# Patient Record
Sex: Female | Born: 1955 | Race: White | Hispanic: No | Marital: Married | Smoking: Never smoker
Health system: Southern US, Community
[De-identification: ages and names within clinical notes are randomized; demographics above are authoritative.]

## PROBLEM LIST (undated history)

## (undated) DIAGNOSIS — Z1159 Encounter for screening for other viral diseases: Secondary | ICD-10-CM

## (undated) DIAGNOSIS — L900 Lichen sclerosus et atrophicus: Secondary | ICD-10-CM

## (undated) DIAGNOSIS — D693 Immune thrombocytopenic purpura: Secondary | ICD-10-CM

## (undated) DIAGNOSIS — I10 Essential (primary) hypertension: Secondary | ICD-10-CM

## (undated) HISTORY — DX: Essential (primary) hypertension: I10

## (undated) HISTORY — DX: Encounter for screening for other viral diseases: Z11.59

## (undated) HISTORY — PX: TUBAL LIGATION: SHX77

## (undated) HISTORY — PX: OTHER SURGICAL HISTORY: SHX169

## (undated) HISTORY — DX: Lichen sclerosus et atrophicus: L90.0

## (undated) HISTORY — PX: TONSILLECTOMY: SUR1361

## (undated) HISTORY — DX: Immune thrombocytopenic purpura: D69.3

---

## 1998-01-26 ENCOUNTER — Other Ambulatory Visit: Admission: RE | Admit: 1998-01-26 | Discharge: 1998-01-26 | Payer: Self-pay | Admitting: Obstetrics and Gynecology

## 1999-12-06 ENCOUNTER — Other Ambulatory Visit: Admission: RE | Admit: 1999-12-06 | Discharge: 1999-12-06 | Payer: Self-pay | Admitting: Gynecology

## 2000-06-24 ENCOUNTER — Encounter: Admission: RE | Admit: 2000-06-24 | Discharge: 2000-06-24 | Payer: Self-pay

## 2001-10-15 ENCOUNTER — Other Ambulatory Visit: Admission: RE | Admit: 2001-10-15 | Discharge: 2001-10-15 | Payer: Self-pay | Admitting: Gynecology

## 2003-02-03 ENCOUNTER — Other Ambulatory Visit: Admission: RE | Admit: 2003-02-03 | Discharge: 2003-02-03 | Payer: Self-pay | Admitting: Gynecology

## 2004-09-09 ENCOUNTER — Ambulatory Visit: Payer: Self-pay | Admitting: Hematology & Oncology

## 2004-10-25 ENCOUNTER — Ambulatory Visit: Payer: Self-pay | Admitting: Hematology & Oncology

## 2004-12-19 ENCOUNTER — Ambulatory Visit: Payer: Self-pay | Admitting: Hematology & Oncology

## 2005-02-26 ENCOUNTER — Ambulatory Visit: Payer: Self-pay | Admitting: Hematology & Oncology

## 2005-04-23 ENCOUNTER — Ambulatory Visit: Payer: Self-pay | Admitting: Hematology & Oncology

## 2005-06-06 ENCOUNTER — Inpatient Hospital Stay (HOSPITAL_COMMUNITY): Admission: RE | Admit: 2005-06-06 | Discharge: 2005-06-08 | Payer: Self-pay | Admitting: General Surgery

## 2005-06-06 ENCOUNTER — Encounter (INDEPENDENT_AMBULATORY_CARE_PROVIDER_SITE_OTHER): Payer: Self-pay | Admitting: Specialist

## 2005-07-09 ENCOUNTER — Ambulatory Visit: Payer: Self-pay | Admitting: Hematology & Oncology

## 2006-10-26 ENCOUNTER — Other Ambulatory Visit: Admission: RE | Admit: 2006-10-26 | Discharge: 2006-10-26 | Payer: Self-pay | Admitting: Gynecology

## 2008-02-03 ENCOUNTER — Other Ambulatory Visit: Admission: RE | Admit: 2008-02-03 | Discharge: 2008-02-03 | Payer: Self-pay | Admitting: Gynecology

## 2009-02-01 ENCOUNTER — Ambulatory Visit: Payer: Self-pay | Admitting: Hematology & Oncology

## 2009-02-01 LAB — CBC WITH DIFFERENTIAL (CANCER CENTER ONLY)
BASO#: 0.1 10*3/uL (ref 0.0–0.2)
BASO%: 0.9 % (ref 0.0–2.0)
EOS%: 2.1 % (ref 0.0–7.0)
HGB: 12.8 g/dL (ref 11.6–15.9)
MCH: 30.4 pg (ref 26.0–34.0)
MCHC: 33.8 g/dL (ref 32.0–36.0)
MONO%: 8.5 % (ref 0.0–13.0)
NEUT#: 8.4 10*3/uL — ABNORMAL HIGH (ref 1.5–6.5)
NEUT%: 59.3 % (ref 39.6–80.0)
RDW: 12.6 % (ref 10.5–14.6)

## 2009-02-01 LAB — CHCC SATELLITE - SMEAR

## 2009-02-01 LAB — RETICULOCYTES (CHCC)
ABS Retic: 34.3 10*3/uL (ref 19.0–186.0)
RBC.: 4.29 MIL/uL (ref 3.87–5.11)

## 2009-02-01 LAB — TECHNOLOGIST REVIEW CHCC SATELLITE

## 2009-07-04 ENCOUNTER — Ambulatory Visit: Payer: Self-pay | Admitting: Gynecology

## 2009-07-04 ENCOUNTER — Other Ambulatory Visit: Admission: RE | Admit: 2009-07-04 | Discharge: 2009-07-04 | Payer: Self-pay | Admitting: Gynecology

## 2009-07-04 ENCOUNTER — Encounter: Payer: Self-pay | Admitting: Gynecology

## 2009-07-09 ENCOUNTER — Ambulatory Visit: Payer: Self-pay | Admitting: Gynecology

## 2009-07-24 ENCOUNTER — Ambulatory Visit: Payer: Self-pay | Admitting: Hematology & Oncology

## 2009-09-10 ENCOUNTER — Ambulatory Visit: Payer: Self-pay | Admitting: Gynecology

## 2010-02-22 ENCOUNTER — Ambulatory Visit: Payer: Self-pay | Admitting: Gynecology

## 2010-07-31 ENCOUNTER — Other Ambulatory Visit: Admission: RE | Admit: 2010-07-31 | Discharge: 2010-07-31 | Payer: Self-pay | Admitting: Gynecology

## 2010-07-31 ENCOUNTER — Ambulatory Visit: Payer: Self-pay | Admitting: Gynecology

## 2010-08-13 HISTORY — PX: OTHER SURGICAL HISTORY: SHX169

## 2010-08-26 ENCOUNTER — Ambulatory Visit: Payer: Self-pay | Admitting: Gynecology

## 2010-08-30 ENCOUNTER — Ambulatory Visit: Payer: Self-pay | Admitting: Gynecology

## 2010-09-16 ENCOUNTER — Ambulatory Visit: Payer: Self-pay | Admitting: Gynecology

## 2011-02-28 NOTE — Discharge Summary (Signed)
[name], [name] NO.:  [id number]   MEDICAL RECORD NO.:  [id number]          PATIENT TYPE:  INP   LOCATION:  1402                         FACILITY:  [facility]   PHYSICIAN:  Adolph Pollack, M.D.DATE OF BIRTH:  04-24-1956   DATE OF ADMISSION:  06/06/2005  DATE OF DISCHARGE:  06/08/2005                                 DISCHARGE SUMMARY   PRINCIPAL DISCHARGE DIAGNOSES:  ITP.   SECONDARY DIAGNOSES:  None.   PROCEDURE:  Laparoscopic splenectomy and excision of accessory spleen.   REASON FOR ADMISSION:  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  She was admitted for  elective laparoscopic splenectomy.   HOSPITAL COURSE:  She underwent a laparoscopic splenectomy and tolerated  this well.  Postoperative day #1 her platelet count was 463,000 and second  postoperative day was 466,000.  Her second postoperative day she is  tolerating a diet, passing gas, felt well, and was able to be discharged.   DISPOSITION:  Discharged to home in satisfactory condition June 08, 2005.  She was given activity restrictions and dietary instructions.  She was given  Tylox for pain and will follow up with me in the office in two to three  weeks.      Adolph Pollack, M.D.  Electronically Signed     TJR/MEDQ  D:  06/18/2005  T:  06/18/2005  Job:  [id number]   cc:   Rose Phi. Myna Hidalgo, M.D.  [address] [facility]  [place], [place] [address]  Fax: [phone]   Raynelle Jan, M.D.  [address]  [place]  [place] [address]  Fax: [phone]

## 2011-02-28 NOTE — Op Note (Signed)
Meghan Reyes, TAX NO.:  [id number]   MEDICAL RECORD NO.:  [id number]          PATIENT TYPE:  INP   LOCATION:  0001                         FACILITY:  [facility]   PHYSICIAN:  Adolph Pollack, M.D.DATE OF BIRTH:  1956/01/18   DATE OF PROCEDURE:  06/06/2005  DATE OF DISCHARGE:                                 OPERATIVE REPORT   PREOPERATIVE DIAGNOSIS:  Idiopathic thrombocytopenic purpura.   POSTOPERATIVE DIAGNOSIS:  Idiopathic thrombocytopenic purpura.   PROCEDURE:  1.  Laparoscopic splenectomy.  2.  Laparoscopic resection of accessory spleen.   SURGEON:  Adolph Pollack, M.D.   ASSISTANT:  Clovis Pu. Cornett, M.D.   ANESTHESIA:  General.   INDICATIONS:  Meghan Reyes is a 55 year old female who has had a long history  of thrombocytopenia. She had been responding initially to steroid treatment,  IVIG and rituxan treatments. However, she subsequently became more  refractory to treatment and has elected to undergo a laparoscopic  splenectomy. She does get a very temporary boosts with IVIG. We discussed  the procedure and the risks including need for open splenectomy. She has  been given IVIG with a good boost in her platelet count.   TECHNIQUE:  She is seen in the holding area. She is brought to the operating  room, placed supine on the operating table and general anesthetic was  administered. Using a bean bag, she was rotated left side up approximately  45 degrees. A Foley catheter had been placed in the bladder. An orogastric  tube was placed. The abdominal wall was sterilely prepped and draped. She  was turned supine and an epigastric incision was made through the skin,  subcutaneous tissue, fascia and peritoneum. A pursestring suture of #0  Vicryl was placed around the fascial edges. A Hassan trocar was introduced  into the peritoneal cavity and pneumoperitoneum was created by insufflation  of CO2 gas. Next the laparoscope was introduced. A 10 mm trocar  was placed  through a subxiphoid incision. A 10 mm trocar was placed to the posterior  axillary line distal to the costal margin. A 10 mm trocar was then placed in  the mid clavicular line in the left upper quadrant. I began by noting that  she had significant of hepatomegaly with a fatty liver and this would become  somewhat problematic later in the operation with respect to exposure.  However, I was able to place her into the reverse Trendelenburg position,  rotate the left side up and identify the spleen. Using the harmonic scalpel,  I began dividing the lateral attachments including the attachments between  the spleen and the colon, spleen and diaphragm, mobilizing the spleen from  lateral to medial. The plane of dissection was kept close to the spleen. I  then began dissecting more toward the hilum staying right on the spleen and  dividing small vessels between clips and using the harmonic scalpel. Once I  had what I felt were adequate  lateral mobilization I then approached the  medial side.   In attempting to identify the short gastric vessels, the large liver was in  impingement  with respect to exposure. I subsequently placed a fifth trocar  in the left upper quadrant between the posterior axillary line trocar and  the mid clavicular line trocar. I then used a triangulated retraction device  to retract the large left lobe of the liver anteriorly. 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 I left this for  later excision.   I then began dividing the short gastric vessels with the harmonic scalpel.  This was made more difficult and took more time because of the large liver  although we were eventually able to get exposure and proceed very slowly. I  then performed some dissection of the medial aspect of the spleen away from  the diaphragmatic attachments also superiorly. I was able to lift the spleen  up and identify the hilum. Using a vascular linear  cutting stapler, I was  able to divide the splenic hilum in there separate loads staying above the  pancreatic tail which was identified. There were some remaining short  gastric vessels and these were divided with the harmonic scalpel. The spleen  was then free from its attachments.   I removed the epigastric trocar and placed a large bag through that incision  into the abdominal cavity and placed the spleen into it. I then approximated  the bag to the anterior abdominal wall and in a piecemeal fashion removed  the spleen.   I inspected the left upper quadrant area and I did not note any significant  act of bleeding just old clots which I evacuated. The estimated blood loss  total was about 300 mL. I then identified the accessory spleen and using a  harmonic scalpel, I excised it, placed it in the bag and brought it out  through one of the trocar sites and put it with the morcellated spleen  specimen. I once again inspected the area and removed the liver retractor.  Hemostasis was adequate. No damage to the tail of pancreas was noted no  intestinal injury was noted.   Following this, I removed the trocars.  I closed the epigastric fascial  defect by tightening up and tying down the pursestring suture. All the skin  incisions were closed with 4-0 Monocryl subcuticular stitches followed by  Steri-Strips and sterile dressings. She tolerated the procedure well without  any apparent complications and was taken to the recovery room in  satisfactory condition.      Adolph Pollack, M.D.  Electronically Signed     TJR/MEDQ  D:  06/06/2005  T:  06/06/2005  Job:  [id number]   cc:   Rose Phi. Myna Hidalgo, M.D.  [address] [facility]  [place], [place] [address]  Fax: [phone]   Raynelle Jan, M.D.  [address]  [place]  [place] [address]  Fax: [phone]

## 2011-10-23 ENCOUNTER — Encounter: Payer: Self-pay | Admitting: *Deleted

## 2011-10-23 DIAGNOSIS — I10 Essential (primary) hypertension: Secondary | ICD-10-CM | POA: Insufficient documentation

## 2011-10-23 DIAGNOSIS — D693 Immune thrombocytopenic purpura: Secondary | ICD-10-CM | POA: Insufficient documentation

## 2011-10-27 ENCOUNTER — Encounter: Payer: Self-pay | Admitting: Gynecology

## 2011-12-24 ENCOUNTER — Telehealth: Payer: Self-pay | Admitting: Hematology & Oncology

## 2011-12-24 NOTE — Telephone Encounter (Signed)
Faxed records to [employer] in response to medical release form.

## 2012-02-02 ENCOUNTER — Encounter: Payer: Self-pay | Admitting: Gynecology

## 2012-02-02 ENCOUNTER — Ambulatory Visit (INDEPENDENT_AMBULATORY_CARE_PROVIDER_SITE_OTHER): Payer: 59 | Admitting: Gynecology

## 2012-02-02 VITALS — BP 124/76 | Ht 64.0 in | Wt 202.0 lb

## 2012-02-02 DIAGNOSIS — Z01419 Encounter for gynecological examination (general) (routine) without abnormal findings: Secondary | ICD-10-CM

## 2012-02-02 DIAGNOSIS — N951 Menopausal and female climacteric states: Secondary | ICD-10-CM

## 2012-02-02 NOTE — Progress Notes (Signed)
Meghan Reyes Feb 10, 1956 [id number]        56 y.o.  for annual exam.  Doing well.  Past medical history,surgical history, medications, allergies, family history and social history were all reviewed and documented in the EPIC chart. ROS:  Was performed and pertinent positives and negatives are included in the history.  Exam: [name] chaperone present Filed Vitals:   02/02/12 1430  BP: 124/76   General appearance  Normal Skin grossly normal Head/Neck normal with no cervical or supraclavicular adenopathy thyroid normal Lungs  clear Cardiac RR, without RMG Abdominal  soft, nontender, without masses, organomegaly or hernia Breasts  examined lying and sitting without masses, retractions, discharge or axillary adenopathy. Pelvic  Ext/BUS/vagina  normal   Cervix  normal    Uterus  axial, normal size, shape and contour, midline and mobile nontender   Adnexa  Without masses or tenderness    Anus and perineum  normal   Rectovaginal  normal sphincter tone without palpated masses or tenderness.    Assessment/Plan:  56 y.o. female for annual exam.    1. Irregular menses. Patient is status post HerOption endometrial ablation November 2011. She has had four menses since then all of which have been light and regular in length. Not having significant hot flushes, night sweats or other symptoms. Will check FSH now otherwise keep menstrual calendar. If she goes more than one year without bleeding then bleeds or has prolonged/atypical bleeding she does report this. 2. Anxiety. Her primary physician's office recently started her on Zoloft for anxiety type symptoms. She finds herself sleeping most of the day. I've asked her to call their office and asked them to make a dose adjustment. 3. Pap smear. Last Pap smear October 2011. She has no history of abnormal Pap smears before. Discussed current screening guidelines we'll plan on every 3 your Pap smear and no Pap smear was done today. 4. Mammography. Patient has  mammogram scheduled April 30 in the fall for this. To continue with annual mammography. SBE monthly reviewed. 5. Colonoscopy. Patient had her colonoscopy 3 years ago she will follow up with their recommended interval. 6. DEXA. She has not had a DEXA and we'll wait several years after the menopause before scheduling. 7. Health maintenance. No other blood work besides her [facility] was ordered as it is all done through her primary physician's office. Patient will keep a menstrual calendar and follow up with me in a year sooner if atypical bleeding or other issues.    Dara Lords MD, 2:56 PM 02/02/2012

## 2012-02-02 NOTE — Patient Instructions (Signed)
Follow up for menopausal hormone results. Otherwise follow up in one year for annual exam. Follow up sooner if prolonged or atypical bleeding.

## 2012-02-03 LAB — URINALYSIS W MICROSCOPIC + REFLEX CULTURE
Bacteria, UA: NONE SEEN
Bilirubin Urine: NEGATIVE
Glucose, UA: NEGATIVE mg/dL
Hgb urine dipstick: NEGATIVE
Protein, ur: NEGATIVE mg/dL
Urobilinogen, UA: 0.2 mg/dL (ref 0.0–1.0)

## 2012-02-19 ENCOUNTER — Encounter: Payer: Self-pay | Admitting: Gynecology

## 2013-02-17 ENCOUNTER — Ambulatory Visit (INDEPENDENT_AMBULATORY_CARE_PROVIDER_SITE_OTHER): Payer: 59 | Admitting: Gynecology

## 2013-02-17 ENCOUNTER — Other Ambulatory Visit (HOSPITAL_COMMUNITY)
Admission: RE | Admit: 2013-02-17 | Discharge: 2013-02-17 | Disposition: A | Discharge status: Home / Self Care | Payer: 59 | Source: Ambulatory Visit | Attending: Gynecology | Admitting: Gynecology

## 2013-02-17 ENCOUNTER — Encounter: Payer: Self-pay | Admitting: Gynecology

## 2013-02-17 VITALS — BP 120/74 | Ht 64.0 in | Wt 208.0 lb

## 2013-02-17 DIAGNOSIS — Z01419 Encounter for gynecological examination (general) (routine) without abnormal findings: Secondary | ICD-10-CM

## 2013-02-17 DIAGNOSIS — Z13220 Encounter for screening for lipoid disorders: Secondary | ICD-10-CM

## 2013-02-17 DIAGNOSIS — N926 Irregular menstruation, unspecified: Secondary | ICD-10-CM

## 2013-02-17 DIAGNOSIS — Z1151 Encounter for screening for human papillomavirus (HPV): Secondary | ICD-10-CM | POA: Insufficient documentation

## 2013-02-17 LAB — COMPREHENSIVE METABOLIC PANEL
Albumin: 4.2 g/dL (ref 3.5–5.2)
Alkaline Phosphatase: 64 U/L (ref 39–117)
BUN: 11 mg/dL (ref 6–23)
Creat: 0.76 mg/dL (ref 0.50–1.10)
Glucose, Bld: 82 mg/dL (ref 70–99)
Total Bilirubin: 0.4 mg/dL (ref 0.3–1.2)

## 2013-02-17 LAB — LIPID PANEL
HDL: 46 mg/dL (ref >39)
LDL Cholesterol: 88 mg/dL (ref 0–99)
Total CHOL/HDL Ratio: 3.8 Ratio
Triglycerides: 200 mg/dL — ABNORMAL HIGH (ref <150)
VLDL: 40 mg/dL (ref 0–40)

## 2013-02-17 MED ORDER — VENLAFAXINE HCL ER 37.5 MG PO CP24: 37.5000 mg | ORAL_CAPSULE | Freq: Every day | ORAL | Status: DC

## 2013-02-17 NOTE — Patient Instructions (Addendum)
Start on Effexor 37.5 mg tablet daily. Call me in one to 2 months in followup. May need to increase to the next dosage if continued symptoms. Followup in one year for annual exam.

## 2013-02-17 NOTE — Progress Notes (Signed)
Meghan Reyes 04-11-1956 [id number]        57 y.o.  G3P3003 for annual exam.  Several issues below.  Past medical history,surgical history, medications, allergies, family history and social history were all reviewed and documented in the EPIC chart. ROS:  Was performed and pertinent positives and negatives are included in the history.  Exam: [name] assistant Filed Vitals:   02/17/13 1542  BP: 120/74  Height: 5\' 4"  (1.626 m)  Weight: 208 lb (94.348 kg)   General appearance  Normal Skin grossly normal Head/Neck normal with no cervical or supraclavicular adenopathy thyroid normal Lungs  clear Cardiac RR, without RMG Abdominal  soft, nontender, without masses, organomegaly or hernia Breasts  examined lying and sitting without masses, retractions, discharge or axillary adenopathy. Pelvic  Ext/BUS/vagina  normal   Cervix  normal Pap/HPV  Uterus  anteverted, normal size, shape and contour, midline and mobile nontender   Adnexa  Without masses or tenderness    Anus and perineum  normal   Rectovaginal  normal sphincter tone without palpated masses or tenderness.    Assessment/Plan:  57 y.o. G61P3003 female for annual exam.   1. Irregular menses. Patient had 2 menses last year and one menses in February of this year. She is status post endometrial ablation. Had normal FSH last year. Not having significant hot flushes night sweats vaginal dryness or other menopausal symptoms. We'll check TSH FSH today. If less frequent but normal periods we'll monitor. If prolonged or atypical bleeding or goes more than one year without periods and then bleed she knows to alert me. 2. Depression. Patient does have history of being on Zoloft before but was discontinued. She notes over the past year increasing lethargy sleepiness mid night waking with inability to fall back asleep. Lack of joy with daily activities. Husband feels she is depressed and recommended she discuss treatment with me. No suicide ideations.  No significant weight changes. Reviewed various options and ultimately we decided probably the Effexor XR 37.5 mg daily. She'll, after one month to see how she's doing and if needed will elevate to 75 mg. She does well on the lower dose and we'll maintain this. Side effect profile as well as suicide ideation risks reviewed and accepted. 3. Mammography 02/2013. Continue with annual mammography. SBE monthly reviewed. 4. Pap smear 2011. Pap/HPV today. No history of abnormal Pap smears previously. 5. Health maintenance. Baseline CBC comprehensive metabolic panel lipid profile urinalysis vitamin D level ordered with above lab work.    Dara Lords MD, 5:00 PM 02/17/2013

## 2013-02-18 ENCOUNTER — Encounter: Payer: Self-pay | Admitting: Gynecology

## 2013-02-18 LAB — URINALYSIS W MICROSCOPIC + REFLEX CULTURE
Bilirubin Urine: NEGATIVE
Glucose, UA: NEGATIVE mg/dL
Protein, ur: NEGATIVE mg/dL

## 2013-02-18 LAB — FOLLICLE STIMULATING HORMONE: FSH: 19.9 m[IU]/mL

## 2013-02-18 LAB — CBC WITH DIFFERENTIAL/PLATELET
Basophils Relative: 1 % (ref 0–1)
Eosinophils Absolute: 0.2 10*3/uL (ref 0.0–0.7)
Eosinophils Relative: 1 % (ref 0–5)
HCT: 32.3 % — ABNORMAL LOW (ref 36.0–46.0)
Hemoglobin: 9.9 g/dL — ABNORMAL LOW (ref 12.0–15.0)
MCH: 22.4 pg — ABNORMAL LOW (ref 26.0–34.0)
MCHC: 30.7 g/dL (ref 30.0–36.0)
Monocytes Absolute: 1.7 10*3/uL — ABNORMAL HIGH (ref 0.1–1.0)
Monocytes Relative: 9 % (ref 3–12)
RDW: 18.4 % — ABNORMAL HIGH (ref 11.5–15.5)

## 2013-02-21 ENCOUNTER — Other Ambulatory Visit: Payer: Self-pay | Admitting: *Deleted

## 2013-02-21 DIAGNOSIS — R7989 Other specified abnormal findings of blood chemistry: Secondary | ICD-10-CM

## 2013-03-15 ENCOUNTER — Telehealth: Payer: Self-pay | Admitting: *Deleted

## 2013-03-15 MED ORDER — FLUOXETINE HCL 20 MG PO CAPS: 20.0000 mg | ORAL_CAPSULE | Freq: Every day | ORAL | Status: DC

## 2013-03-15 NOTE — Telephone Encounter (Signed)
Pt informed with the below note, rx sent. 

## 2013-03-15 NOTE — Telephone Encounter (Signed)
Pt started on Effexor 37.5 one daily on 02/17/13, pt said every since taking medication she has had headaches and dry mouth. Pt can deal with the dry mouth but the headaches everyday she can not. Pt asked if anything else could be given? Please advise

## 2013-03-15 NOTE — Telephone Encounter (Signed)
I would stop the medication and start on fluoxetine 20 mg daily #30 refill x6, call if she has any continued issues

## 2014-08-14 ENCOUNTER — Encounter: Payer: Self-pay | Admitting: Gynecology

## 2014-09-12 DIAGNOSIS — Z1159 Encounter for screening for other viral diseases: Secondary | ICD-10-CM

## 2014-09-12 HISTORY — DX: Encounter for screening for other viral diseases: Z11.59

## 2014-09-26 ENCOUNTER — Encounter: Payer: Self-pay | Admitting: Gynecology

## 2014-09-26 ENCOUNTER — Ambulatory Visit (INDEPENDENT_AMBULATORY_CARE_PROVIDER_SITE_OTHER): Payer: BC Managed Care – PPO | Admitting: Gynecology

## 2014-09-26 VITALS — BP 130/78 | Ht 64.0 in | Wt 206.0 lb

## 2014-09-26 DIAGNOSIS — Z01419 Encounter for gynecological examination (general) (routine) without abnormal findings: Secondary | ICD-10-CM

## 2014-09-26 NOTE — Progress Notes (Signed)
Genelle BalLucinda K Sinagra January 24, 1956 [id number]        58 y.o.  G3P3003 for annual exam.  Several issues noted below.  Past medical history,surgical history, problem list, medications, allergies, family history and social history were all reviewed and documented as reviewed in the EPIC chart.  ROS:  12 system ROS performed with pertinent positives and negatives included in the history, assessment and plan.   Additional significant findings :  none   Exam: [name] personassistant Filed Vitals:   09/26/14 1414  BP: 130/78  Height: 5\' 4"  (1.626 m)  Weight: 206 lb (93.441 kg)   General appearance:  Normal affect, orientation and appearance. Skin: Grossly normal HEENT: Without gross lesions.  No cervical or supraclavicular adenopathy. Thyroid normal.  Lungs:  Clear without wheezing, rales or rhonchi Cardiac: RR, without RMG Abdominal:  Soft, nontender, without masses, guarding, rebound, organomegaly or hernia Breasts:  Examined lying and sitting without masses, retractions, discharge or axillary adenopathy. Pelvic:  Ext/BUS/vagina with atrophic changes  Cervix with atrophic changes  Uterus anteverted, normal size, shape and contour, midline and mobile nontender   Adnexa  Without masses or tenderness    Anus and perineum  Normal   Rectovaginal  Normal sphincter tone without palpated masses or tenderness.    Assessment/Plan:  58 y.o. 433P3003 female for annual exam.   1. Postmenopausal/atrophic genital changes.  No bleeding since February 2014. No significant hot flashes night sweats or vaginal dryness. No vaginal bleeding. Continue to monitor. Report any vaginal bleeding. Started on Effexor last year but she discontinued and is doing well without any medication. 2. Mammography 02/2013. Patient has scheduled next month and will follow up for this. SBE monthly reviewed.  3. Pap smear/HPV negative 2014.  No Pap smear done today. No history of significant abnormal Pap smear. Plan repeat Pap smear in 3-5 year  interval per current screening guidelines. 4. DEXA never. Plan Baseline at age 58. Increase calcium vitamin D reviewed. Check vitamin D level today. 5. Colonoscopy 2009. Recommended repeat interval per her history is 10 years. 6. Health maintenance. Baseline CBC comprehensive metabolic panel lipid profile urinalysis vitamin D TSH hep C ordered. Her CBC was abnormal last year and I asked her repeatedly she did not return to do so.  Received repeat CBC now and follow up with results. Possible hematology referral if significantly abnormal.     [name],Taj Arteaga P MD, 2:32 PM 09/26/2014

## 2014-09-26 NOTE — Patient Instructions (Addendum)
You may obtain a copy of any labs that were done today by logging onto MyChart as outlined in the instructions provided with your AVS (after visit summary). The office will not call with normal lab results but certainly if there are any significant abnormalities then we will contact you.   Health Maintenance, Female A healthy lifestyle and preventative care can promote health and wellness.  Maintain regular health, dental, and eye exams.  Eat a healthy diet. Foods like vegetables, fruits, whole grains, low-fat dairy products, and lean protein foods contain the nutrients you need without too many calories. Decrease your intake of foods high in solid fats, added sugars, and salt. Get information about a proper diet from your caregiver, if necessary.  Regular physical exercise is one of the most important things you can do for your health. Most adults should get at least 150 minutes of moderate-intensity exercise (any activity that increases your heart rate and causes you to sweat) each week. In addition, most adults need muscle-strengthening exercises on 2 or more days a week.   Maintain a healthy weight. The body mass index (BMI) is a screening tool to identify possible weight problems. It provides an estimate of body fat based on height and weight. Your caregiver can help determine your BMI, and can help you achieve or maintain a healthy weight. For adults 20 years and older:  A BMI below 18.5 is considered underweight.  A BMI of 18.5 to 24.9 is normal.  A BMI of 25 to 29.9 is considered overweight.  A BMI of 30 and above is considered obese.  Maintain normal blood lipids and cholesterol by exercising and minimizing your intake of saturated fat. Eat a balanced diet with plenty of fruits and vegetables. Blood tests for lipids and cholesterol should begin at age 61 and be repeated every 5 years. If your lipid or cholesterol levels are high, you are over 50, or you are a high risk for heart  disease, you may need your cholesterol levels checked more frequently.Ongoing high lipid and cholesterol levels should be treated with medicines if diet and exercise are not effective.  If you smoke, find out from your caregiver how to quit. If you do not use tobacco, do not start.  Lung cancer screening is recommended for adults aged 33 80 years who are at high risk for developing lung cancer because of a history of smoking. Yearly low-dose computed tomography (CT) is recommended for people who have at least a 30-pack-year history of smoking and are a current smoker or have quit within the past 15 years. A pack year of smoking is smoking an average of 1 pack of cigarettes a day for 1 year (for example: 1 pack a day for 30 years or 2 packs a day for 15 years). Yearly screening should continue until the smoker has stopped smoking for at least 15 years. Yearly screening should also be stopped for people who develop a health problem that would prevent them from having lung cancer treatment.  If you are pregnant, do not drink alcohol. If you are breastfeeding, be very cautious about drinking alcohol. If you are not pregnant and choose to drink alcohol, do not exceed 1 drink per day. One drink is considered to be 12 ounces (355 mL) of beer, 5 ounces (148 mL) of wine, or 1.5 ounces (44 mL) of liquor.  Avoid use of street drugs. Do not share needles with anyone. Ask for help if you need support or instructions about stopping  the use of drugs.  High blood pressure causes heart disease and increases the risk of stroke. Blood pressure should be checked at least every 1 to 2 years. Ongoing high blood pressure should be treated with medicines, if weight loss and exercise are not effective.  If you are 59 to 56 years old, ask your caregiver if you should take aspirin to prevent strokes.  Diabetes screening involves taking a blood sample to check your fasting blood sugar level. This should be done once every 3  years, after age 91, if you are within normal weight and without risk factors for diabetes. Testing should be considered at a younger age or be carried out more frequently if you are overweight and have at least 1 risk factor for diabetes.  Breast cancer screening is essential preventative care for women. You should practice "breast self-awareness." This means understanding the normal appearance and feel of your breasts and may include breast self-examination. Any changes detected, no matter how small, should be reported to a caregiver. Women in their 66s and 30s should have a clinical breast exam (CBE) by a caregiver as part of a regular health exam every 1 to 3 years. After age 101, women should have a CBE every year. Starting at age 100, women should consider having a mammogram (breast X-ray) every year. Women who have a family history of breast cancer should talk to their caregiver about genetic screening. Women at a high risk of breast cancer should talk to their caregiver about having an MRI and a mammogram every year.  Breast cancer gene (BRCA)-related cancer risk assessment is recommended for women who have family members with BRCA-related cancers. BRCA-related cancers include breast, ovarian, tubal, and peritoneal cancers. Having family members with these cancers may be associated with an increased risk for harmful changes (mutations) in the breast cancer genes BRCA1 and BRCA2. Results of the assessment will determine the need for genetic counseling and BRCA1 and BRCA2 testing.  The Pap test is a screening test for cervical cancer. Women should have a Pap test starting at age 57. Between ages 25 and 35, Pap tests should be repeated every 2 years. Beginning at age 37, you should have a Pap test every 3 years as long as the past 3 Pap tests have been normal. If you had a hysterectomy for a problem that was not cancer or a condition that could lead to cancer, then you no longer need Pap tests. If you are  between ages 50 and 76, and you have had normal Pap tests going back 10 years, you no longer need Pap tests. If you have had past treatment for cervical cancer or a condition that could lead to cancer, you need Pap tests and screening for cancer for at least 20 years after your treatment. If Pap tests have been discontinued, risk factors (such as a new sexual partner) need to be reassessed to determine if screening should be resumed. Some women have medical problems that increase the chance of getting cervical cancer. In these cases, your caregiver may recommend more frequent screening and Pap tests.  The human papillomavirus (HPV) test is an additional test that may be used for cervical cancer screening. The HPV test looks for the virus that can cause the cell changes on the cervix. The cells collected during the Pap test can be tested for HPV. The HPV test could be used to screen women aged 44 years and older, and should be used in women of any age  who have unclear Pap test results. After the age of 55, women should have HPV testing at the same frequency as a Pap test.  Colorectal cancer can be detected and often prevented. Most routine colorectal cancer screening begins at the age of 44 and continues through age 20. However, your caregiver may recommend screening at an earlier age if you have risk factors for colon cancer. On a yearly basis, your caregiver may provide home test kits to check for hidden blood in the stool. Use of a small camera at the end of a tube, to directly examine the colon (sigmoidoscopy or colonoscopy), can detect the earliest forms of colorectal cancer. Talk to your caregiver about this at age 86, when routine screening begins. Direct examination of the colon should be repeated every 5 to 10 years through age 13, unless early forms of pre-cancerous polyps or small growths are found.  Hepatitis C blood testing is recommended for all people born from [birth year] through 1963 and any  individual with known risks for hepatitis C.  Practice safe sex. Use condoms and avoid high-risk sexual practices to reduce the spread of sexually transmitted infections (STIs). Sexually active women aged 36 and younger should be checked for Chlamydia, which is a common sexually transmitted infection. Older women with new or multiple partners should also be tested for Chlamydia. Testing for other STIs is recommended if you are sexually active and at increased risk.  Osteoporosis is a disease in which the bones lose minerals and strength with aging. This can result in serious bone fractures. The risk of osteoporosis can be identified using a bone density scan. Women ages 20 and over and women at risk for fractures or osteoporosis should discuss screening with their caregivers. Ask your caregiver whether you should be taking a calcium supplement or vitamin D to reduce the rate of osteoporosis.  Menopause can be associated with physical symptoms and risks. Hormone replacement therapy is available to decrease symptoms and risks. You should talk to your caregiver about whether hormone replacement therapy is right for you.  Use sunscreen. Apply sunscreen liberally and repeatedly throughout the day. You should seek shade when your shadow is shorter than you. Protect yourself by wearing long sleeves, pants, a wide-brimmed hat, and sunglasses year round, whenever you are outdoors.  Notify your caregiver of new moles or changes in moles, especially if there is a change in shape or color. Also notify your caregiver if a mole is larger than the size of a pencil eraser.  Stay current with your immunizations. Document Released: 04/14/2011 Document Revised: 01/24/2013 Document Reviewed: 04/14/2011 [facility] Patient Information 2014 [employer].

## 2014-09-27 ENCOUNTER — Other Ambulatory Visit: Payer: BC Managed Care – PPO

## 2014-09-27 LAB — URINALYSIS W MICROSCOPIC + REFLEX CULTURE
Bacteria, UA: NONE SEEN
Bilirubin Urine: NEGATIVE
CASTS: NONE SEEN
CRYSTALS: NONE SEEN
GLUCOSE, UA: NEGATIVE mg/dL
Hgb urine dipstick: NEGATIVE
KETONES UR: NEGATIVE mg/dL
LEUKOCYTES UA: NEGATIVE
NITRITE: NEGATIVE
PH: 6 (ref 5.0–8.0)
Protein, ur: NEGATIVE mg/dL
SPECIFIC GRAVITY, URINE: 1.017 (ref 1.005–1.030)
Urobilinogen, UA: 0.2 mg/dL (ref 0.0–1.0)

## 2014-09-27 LAB — CBC WITH DIFFERENTIAL/PLATELET
BASOS PCT: 1 % (ref 0–1)
Basophils Absolute: 0.1 10*3/uL (ref 0.0–0.1)
Eosinophils Absolute: 0.2 10*3/uL (ref 0.0–0.7)
Eosinophils Relative: 2 % (ref 0–5)
HEMATOCRIT: 32 % — AB (ref 36.0–46.0)
HEMOGLOBIN: 10.3 g/dL — AB (ref 12.0–15.0)
LYMPHS ABS: 4.9 10*3/uL — AB (ref 0.7–4.0)
LYMPHS PCT: 42 % (ref 12–46)
MCH: 23.7 pg — ABNORMAL LOW (ref 26.0–34.0)
MCHC: 32.2 g/dL (ref 30.0–36.0)
MCV: 73.6 fL — AB (ref 78.0–100.0)
MONO ABS: 0.8 10*3/uL (ref 0.1–1.0)
MONOS PCT: 7 % (ref 3–12)
MPV: 11.4 fL (ref 9.4–12.4)
NEUTROS ABS: 5.6 10*3/uL (ref 1.7–7.7)
NEUTROS PCT: 48 % (ref 43–77)
Platelets: 391 10*3/uL (ref 150–400)
RBC: 4.35 MIL/uL (ref 3.87–5.11)
RDW: 18.5 % — ABNORMAL HIGH (ref 11.5–15.5)
WBC: 11.6 10*3/uL — AB (ref 4.0–10.5)

## 2014-09-27 LAB — COMPREHENSIVE METABOLIC PANEL
ALBUMIN: 4.1 g/dL (ref 3.5–5.2)
ALK PHOS: 62 U/L (ref 39–117)
ALT: 11 U/L (ref 0–35)
AST: 19 U/L (ref 0–37)
BUN: 14 mg/dL (ref 6–23)
CALCIUM: 9.4 mg/dL (ref 8.4–10.5)
CHLORIDE: 103 meq/L (ref 96–112)
CO2: 27 mEq/L (ref 19–32)
Creat: 0.65 mg/dL (ref 0.50–1.10)
GLUCOSE: 79 mg/dL (ref 70–99)
POTASSIUM: 4.3 meq/L (ref 3.5–5.3)
SODIUM: 139 meq/L (ref 135–145)
TOTAL PROTEIN: 7 g/dL (ref 6.0–8.3)
Total Bilirubin: 0.5 mg/dL (ref 0.2–1.2)

## 2014-09-27 LAB — LIPID PANEL
Cholesterol: 144 mg/dL (ref 0–200)
HDL: 48 mg/dL (ref >39)
LDL CALC: 80 mg/dL (ref 0–99)
TRIGLYCERIDES: 82 mg/dL (ref <150)
Total CHOL/HDL Ratio: 3 Ratio
VLDL: 16 mg/dL (ref 0–40)

## 2014-09-28 LAB — TSH: TSH: 3.32 u[IU]/mL (ref 0.350–4.500)

## 2014-09-28 LAB — VITAMIN D 25 HYDROXY (VIT D DEFICIENCY, FRACTURES): Vit D, 25-Hydroxy: 31 ng/mL (ref 30–100)

## 2014-09-28 LAB — HEPATITIS C ANTIBODY: HCV Ab: NEGATIVE

## 2014-10-02 ENCOUNTER — Other Ambulatory Visit: Payer: Self-pay | Admitting: Gynecology

## 2014-10-02 DIAGNOSIS — D649 Anemia, unspecified: Secondary | ICD-10-CM

## 2014-10-13 DIAGNOSIS — L900 Lichen sclerosus et atrophicus: Secondary | ICD-10-CM

## 2014-10-13 HISTORY — DX: Lichen sclerosus et atrophicus: L90.0

## 2014-10-26 ENCOUNTER — Encounter: Payer: Self-pay | Admitting: Gynecology

## 2014-12-01 ENCOUNTER — Other Ambulatory Visit: Payer: Self-pay

## 2015-02-07 ENCOUNTER — Encounter: Payer: Self-pay | Admitting: Women's Health

## 2015-02-07 ENCOUNTER — Ambulatory Visit (INDEPENDENT_AMBULATORY_CARE_PROVIDER_SITE_OTHER): Payer: BLUE CROSS/BLUE SHIELD | Admitting: Women's Health

## 2015-02-07 VITALS — BP 120/78

## 2015-02-07 DIAGNOSIS — B3731 Acute candidiasis of vulva and vagina: Secondary | ICD-10-CM

## 2015-02-07 DIAGNOSIS — L298 Other pruritus: Secondary | ICD-10-CM | POA: Diagnosis not present

## 2015-02-07 DIAGNOSIS — N898 Other specified noninflammatory disorders of vagina: Secondary | ICD-10-CM

## 2015-02-07 DIAGNOSIS — B373 Candidiasis of vulva and vagina: Secondary | ICD-10-CM | POA: Diagnosis not present

## 2015-02-07 LAB — WET PREP FOR TRICH, YEAST, CLUE
CLUE CELLS WET PREP: NONE SEEN
Trich, Wet Prep: NONE SEEN
WBC, Wet Prep HPF POC: NONE SEEN
YEAST WET PREP: NONE SEEN

## 2015-02-07 MED ORDER — FLUCONAZOLE 150 MG PO TABS: ORAL_TABLET | ORAL | Status: DC

## 2015-02-07 NOTE — Progress Notes (Signed)
Patient ID: Meghan Reyes, female   DOB: [birth year], 59 y.o.   MRN: [id number] Presents with vaginal irritation with intense itching only at night, no relief with over-the-counter Monistat, denies discharge, odor, abdominal pain or fever. Mild burning at initiation of stream of urination none at end of stream. Postmenopausal/no bleeding. Occasional hot flushes, uses a scented vaginal lubricants.  Exam: Appears well. External genitalia perineum and introitus excoriated , wet prep done with a Q-tip  Negative. Vagina appears dry.  Atrophic vaginitis Vaginal itching  Plan: Diflucan 150 by mouth 1 dose, Benadryl 25 mg at at bedtime, discard scented lubrication and use a plain lubricant. If no relief instructed to call.

## 2015-02-07 NOTE — Patient Instructions (Signed)

## 2015-02-28 ENCOUNTER — Encounter: Payer: Self-pay | Admitting: Gynecology

## 2015-02-28 ENCOUNTER — Ambulatory Visit (INDEPENDENT_AMBULATORY_CARE_PROVIDER_SITE_OTHER): Payer: BLUE CROSS/BLUE SHIELD | Admitting: Gynecology

## 2015-02-28 VITALS — BP 124/78

## 2015-02-28 DIAGNOSIS — R1909 Other intra-abdominal and pelvic swelling, mass and lump: Secondary | ICD-10-CM

## 2015-02-28 DIAGNOSIS — N762 Acute vulvitis: Secondary | ICD-10-CM | POA: Diagnosis not present

## 2015-02-28 LAB — WET PREP FOR TRICH, YEAST, CLUE
CLUE CELLS WET PREP: NONE SEEN
TRICH WET PREP: NONE SEEN
Yeast Wet Prep HPF POC: NONE SEEN

## 2015-02-28 MED ORDER — FLUCONAZOLE 200 MG PO TABS: 200.0000 mg | ORAL_TABLET | Freq: Every day | ORAL | Status: DC

## 2015-02-28 MED ORDER — LORAZEPAM 1 MG PO TABS: 1.0000 mg | ORAL_TABLET | Freq: Three times a day (TID) | ORAL | Status: AC

## 2015-02-28 MED ORDER — CLOBETASOL PROPIONATE 0.05 % EX CREA: TOPICAL_CREAM | CUTANEOUS | Status: DC

## 2015-02-28 NOTE — Progress Notes (Signed)
Meghan Reyes [birth year] [id number]        59 y.o.  G3P3003 Presents with a month or so of intense vulvar itching primarily at night.  She saw Harriett Sineancy the end of April was treated with Diflucan and Benadryl but still has the itching. No significant discharge or odor. No dysuria frequency urgency. She also has noted a knot in her right groin over the last week or so. Is nontender and nondraining. She noticed it incidentally while showering.  Past medical history,surgical history, problem list, medications, allergies, family history and social history were all reviewed and documented in the EPIC chart.  Directed ROS with pertinent positives and negatives documented in the history of present illness/assessment and plan.  Exam: [name] assistant Filed Vitals:   02/28/15 0958  BP: 124/78   General appearance:  Normal Abdomen soft nontender without masses guarding rebound Pelvic external BUS vagina with marble size subcutaneous nodule right groin crease. Nontender and non-inflammatory without redness or drainage. Symmetrical intense vulvitis from periclitoral region to the perianal region. Excoriated areas where she scratched. Vagina with atrophic changes. Cervix grossly normal. Uterus normal size below mobile nontender. Adnexa without masses or tenderness.  Assessment/Plan:  59 y.o. G3P3003 with:  1. Intense vulvitis over the past month. Suspicious for yeast given the red irritative appearance. Wet prep was negative. Will cover with Diflucan 200 mg daily 7 days. Temovate 0.05% cream twice daily as needed and Ativan 1 mg at bedtime #20 with precautions as far as sedation. Follow up in 2 weeks regardless for reinspection. Sooner if any issues. 2. Right groin mass. Suspect sebaceous cyst or other benign subcutaneous nodule such as lymph node. Noninflammatory and nondraining. Reviewed options to include observation versus excision. The issues of the groin with surrounding neurovascular structures discussed.  Given that she has no symptoms from will watch it for now and reinspect in 2 weeks when she comes back for vulvar follow up. Patient comfortable with the plan.    [name],Zakaree Mcclenahan P MD, 10:26 AM 02/28/2015

## 2015-02-28 NOTE — Patient Instructions (Signed)
Take a Diflucan pill daily for 7 days. Use the steroid cream externally twice daily. Take the Ativan pill at bedtime to help with the itching. Be careful it can make you sleepy. Follow up in 2 weeks for reexamination.

## 2015-03-16 ENCOUNTER — Ambulatory Visit: Payer: BLUE CROSS/BLUE SHIELD | Admitting: Gynecology

## 2015-03-21 ENCOUNTER — Ambulatory Visit (INDEPENDENT_AMBULATORY_CARE_PROVIDER_SITE_OTHER): Payer: BLUE CROSS/BLUE SHIELD | Admitting: Gynecology

## 2015-03-21 ENCOUNTER — Encounter: Payer: Self-pay | Admitting: Gynecology

## 2015-03-21 VITALS — BP 122/78

## 2015-03-21 DIAGNOSIS — N762 Acute vulvitis: Secondary | ICD-10-CM | POA: Diagnosis not present

## 2015-03-21 NOTE — Patient Instructions (Signed)
Office will call you with biopsy results. For now continue with the cream to the vulva nightly.

## 2015-03-21 NOTE — Progress Notes (Signed)
Meghan Reyes 08/27/1956 [id number]        59 y.o.  G3P3003 presents in follow up having been seen several weeks ago with an intense generalized vulvitis. Was treated with Diflucan 1 week and Temovate 0.05% cream nightly. Also had a small cutaneous nodule right crease of her groin thought to be a sebaceous cyst or possible small lymph node.  Past medical history,surgical history, problem list, medications, allergies, family history and social history were all reviewed and documented in the EPIC chart.  Directed ROS with pertinent positives and negatives documented in the history of present illness/assessment and plan.  Exam: Meghan Reyes assistant Filed Vitals:   03/21/15 1513  BP: 122/78   General appearance:  Normal External BUS vagina with symmetrical white change from periclitoral region to the perineal body. The more acute vulvitis symptoms have resolved. Small cutaneous nodule right crease of the groin smaller than before, noninflammatory with no overlying skin changes.  Procedure: Skin overlying the upper right labia majora was cleansed with Betadine, infiltrated with 1% lidocaine and a small skin biopsy was taken with the scalpel. Several nitrate applied afterwards. Patient tolerated well.  Assessment/Plan:  59 y.o. [id number] with history of vulvitis improved symptomatically with no longer itching or irritation. 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 Skin biopsy was taken. Patient will continue on Temovate cream for now. Discussed lichen sclerosis in general in the issues of the chronicity of the condition and need for repetitive doses of treatment intermittently. Try to avoid continuous use due to some thinning of the skin from the steroid cream. Patient will follow up for the biopsy results.  Small cutaneous nodule right crease of the groin smaller in size. Suspect either a small sebaceous cyst or possible small lymph  node that was reactive to the vulvitis. Regardless it is smaller and patient will follow and if remains the same or resolves then will follow. If enlarges or becomes symptomatic she'll call for further evaluation.    Meghan Reyes,Meghan Reyes P MD, 3:43 PM 03/21/2015

## 2015-03-29 ENCOUNTER — Encounter: Payer: Self-pay | Admitting: Gynecology

## 2015-09-04 ENCOUNTER — Other Ambulatory Visit: Payer: Self-pay | Admitting: Gynecology

## 2015-11-06 ENCOUNTER — Encounter: Payer: BLUE CROSS/BLUE SHIELD | Admitting: Gynecology

## 2015-12-17 ENCOUNTER — Encounter: Payer: Self-pay | Admitting: Gynecology

## 2015-12-17 ENCOUNTER — Ambulatory Visit (INDEPENDENT_AMBULATORY_CARE_PROVIDER_SITE_OTHER): Payer: BLUE CROSS/BLUE SHIELD | Admitting: Gynecology

## 2015-12-17 VITALS — BP 134/84 | Ht 64.0 in | Wt 210.0 lb

## 2015-12-17 DIAGNOSIS — Z01419 Encounter for gynecological examination (general) (routine) without abnormal findings: Secondary | ICD-10-CM

## 2015-12-17 DIAGNOSIS — L900 Lichen sclerosus et atrophicus: Secondary | ICD-10-CM

## 2015-12-17 DIAGNOSIS — N952 Postmenopausal atrophic vaginitis: Secondary | ICD-10-CM

## 2015-12-17 NOTE — Patient Instructions (Signed)
Use the clobetasol cream for the irritation nightly for 3 weeks whenever irritated. This will help to keep recurrences low.  Menopause is a normal process in which your reproductive ability comes to an end. This process happens gradually over a span of months to years, usually between the ages of 12 and 13. Menopause is complete when you have missed 12 consecutive menstrual periods. It is important to talk with your health care provider about some of the most common conditions that affect postmenopausal women, such as heart disease, cancer, and bone loss (osteoporosis). Adopting a healthy lifestyle and getting preventive care can help to promote your health and wellness. Those actions can also lower your chances of developing some of these common conditions. WHAT SHOULD I KNOW ABOUT MENOPAUSE? During menopause, you may experience a number of symptoms, such as:  Moderate-to-severe hot flashes.  Night sweats.  Decrease in sex drive.  Mood swings.  Headaches.  Tiredness.  Irritability.  Memory problems.  Insomnia. Choosing to treat or not to treat menopausal changes is an individual decision that you make with your health care provider. WHAT SHOULD I KNOW ABOUT HORMONE REPLACEMENT THERAPY AND SUPPLEMENTS? Hormone therapy products are effective for treating symptoms that are associated with menopause, such as hot flashes and night sweats. Hormone replacement carries certain risks, especially as you become older. If you are thinking about using estrogen or estrogen with progestin treatments, discuss the benefits and risks with your health care provider. WHAT SHOULD I KNOW ABOUT HEART DISEASE AND STROKE? Heart disease, heart attack, and stroke become more likely as you age. This may be due, in part, to the hormonal changes that your body experiences during menopause. These can affect how your body processes dietary fats, triglycerides, and cholesterol. Heart attack and stroke are both medical  emergencies. There are many things that you can do to help prevent heart disease and stroke:  Have your blood pressure checked at least every 1-2 years. High blood pressure causes heart disease and increases the risk of stroke.  If you are 58-58 years old, ask your health care provider if you should take aspirin to prevent a heart attack or a stroke.  Do not use any tobacco products, including cigarettes, chewing tobacco, or electronic cigarettes. If you need help quitting, ask your health care provider.  It is important to eat a healthy diet and maintain a healthy weight.  Be sure to include plenty of vegetables, fruits, low-fat dairy products, and lean protein.  Avoid eating foods that are high in solid fats, added sugars, or salt (sodium).  Get regular exercise. This is one of the most important things that you can do for your health.  Try to exercise for at least 150 minutes each week. The type of exercise that you do should increase your heart rate and make you sweat. This is known as moderate-intensity exercise.  Try to do strengthening exercises at least twice each week. Do these in addition to the moderate-intensity exercise.  Know your numbers.Ask your health care provider to check your cholesterol and your blood glucose. Continue to have your blood tested as directed by your health care provider. WHAT SHOULD I KNOW ABOUT CANCER SCREENING? There are several types of cancer. Take the following steps to reduce your risk and to catch any cancer development as early as possible. Breast Cancer  Practice breast self-awareness.  This means understanding how your breasts normally appear and feel.  It also means doing regular breast self-exams. Let your health care  provider know about any changes, no matter how small.  If you are 58 or older, have a clinician do a breast exam (clinical breast exam or CBE) every year. Depending on your age, family history, and medical history, it may  be recommended that you also have a yearly breast X-ray (mammogram).  If you have a family history of breast cancer, talk with your health care provider about genetic screening.  If you are at high risk for breast cancer, talk with your health care provider about having an MRI and a mammogram every year.  Breast cancer (BRCA) gene test is recommended for women who have family members with BRCA-related cancers. Results of the assessment will determine the need for genetic counseling and BRCA1 and for BRCA2 testing. BRCA-related cancers include these types:  Breast. This occurs in males or females.  Ovarian.  Tubal. This may also be called fallopian tube cancer.  Cancer of the abdominal or pelvic lining (peritoneal cancer).  Prostate.  Pancreatic. Cervical, Uterine, and Ovarian Cancer Your health care provider may recommend that you be screened regularly for cancer of the pelvic organs. These include your ovaries, uterus, and vagina. This screening involves a pelvic exam, which includes checking for microscopic changes to the surface of your cervix (Pap test).  For women ages 21-65, health care providers may recommend a pelvic exam and a Pap test every three years. For women ages 58-58, they may recommend the Pap test and pelvic exam, combined with testing for human papilloma virus (HPV), every five years. Some types of HPV increase your risk of cervical cancer. Testing for HPV may also be done on women of any age who have unclear Pap test results.  Other health care providers may not recommend any screening for nonpregnant women who are considered low risk for pelvic cancer and have no symptoms. Ask your health care provider if a screening pelvic exam is right for you.  If you have had past treatment for cervical cancer or a condition that could lead to cancer, you need Pap tests and screening for cancer for at least 20 years after your treatment. If Pap tests have been discontinued for you,  your risk factors (such as having a new sexual partner) need to be reassessed to determine if you should start having screenings again. Some women have medical problems that increase the chance of getting cervical cancer. In these cases, your health care provider may recommend that you have screening and Pap tests more often.  If you have a family history of uterine cancer or ovarian cancer, talk with your health care provider about genetic screening.  If you have vaginal bleeding after reaching menopause, tell your health care provider.  There are currently no reliable tests available to screen for ovarian cancer. Lung Cancer Lung cancer screening is recommended for adults 65-29 years old who are at high risk for lung cancer because of a history of smoking. A yearly low-dose CT scan of the lungs is recommended if you:  Currently smoke.  Have a history of at least 30 pack-years of smoking and you currently smoke or have quit within the past 15 years. A pack-year is smoking an average of one pack of cigarettes per day for one year. Yearly screening should:  Continue until it has been 15 years since you quit.  Stop if you develop a health problem that would prevent you from having lung cancer treatment. Colorectal Cancer  This type of cancer can be detected and can often  be prevented.  Routine colorectal cancer screening usually begins at age 42 and continues through age 49.  If you have risk factors for colon cancer, your health care provider may recommend that you be screened at an earlier age.  If you have a family history of colorectal cancer, talk with your health care provider about genetic screening.  Your health care provider may also recommend using home test kits to check for hidden blood in your stool.  A small camera at the end of a tube can be used to examine your colon directly (sigmoidoscopy or colonoscopy). This is done to check for the earliest forms of colorectal  cancer.  Direct examination of the colon should be repeated every 5-10 years until age 20. However, if early forms of precancerous polyps or small growths are found or if you have a family history or genetic risk for colorectal cancer, you may need to be screened more often. Skin Cancer  Check your skin from head to toe regularly.  Monitor any moles. Be sure to tell your health care provider:  About any new moles or changes in moles, especially if there is a change in a mole's shape or color.  If you have a mole that is larger than the size of a pencil eraser.  If any of your family members has a history of skin cancer, especially at a young age, talk with your health care provider about genetic screening.  Always use sunscreen. Apply sunscreen liberally and repeatedly throughout the day.  Whenever you are outside, protect yourself by wearing long sleeves, pants, a wide-brimmed hat, and sunglasses. WHAT SHOULD I KNOW ABOUT OSTEOPOROSIS? Osteoporosis is a condition in which bone destruction happens more quickly than new bone creation. After menopause, you may be at an increased risk for osteoporosis. To help prevent osteoporosis or the bone fractures that can happen because of osteoporosis, the following is recommended:  If you are 3-17 years old, get at least 1,000 mg of calcium and at least 600 mg of vitamin D per day.  If you are older than age 29 but younger than age 3, get at least 1,200 mg of calcium and at least 600 mg of vitamin D per day.  If you are older than age 41, get at least 1,200 mg of calcium and at least 800 mg of vitamin D per day. Smoking and excessive alcohol intake increase the risk of osteoporosis. Eat foods that are rich in calcium and vitamin D, and do weight-bearing exercises several times each week as directed by your health care provider. WHAT SHOULD I KNOW ABOUT HOW MENOPAUSE AFFECTS [place]? Depression may occur at any age, but it is more common as  you become older. Common symptoms of depression include:  Low or sad mood.  Changes in sleep patterns.  Changes in appetite or eating patterns.  Feeling an overall lack of motivation or enjoyment of activities that you previously enjoyed.  Frequent crying spells. Talk with your health care provider if you think that you are experiencing depression. WHAT SHOULD I KNOW ABOUT IMMUNIZATIONS? It is important that you get and maintain your immunizations. These include:  Tetanus, diphtheria, and pertussis (Tdap) booster vaccine.  Influenza every year before the flu season begins.  Pneumonia vaccine.  Shingles vaccine. Your health care provider may also recommend other immunizations.   This information is not intended to replace advice given to you by your health care provider. Make sure you discuss any questions you have with  your health care provider.   Document Released: 11/21/2005 Document Revised: 10/20/2014 Document Reviewed: 06/01/2014 Elsevier Interactive Patient Education [employer].

## 2015-12-17 NOTE — Progress Notes (Signed)
    Genelle BalLucinda K Ruffolo [birth year] [id number]        60 y.o.  G3P3003  for annual exam.  Several issues noted below.  Past medical history,surgical history, problem list, medications, allergies, family history and social history were all reviewed and documented as reviewed in the EPIC chart.  ROS:  Performed with pertinent positives and negatives included in the history, assessment and plan.   Additional significant findings :  none   Exam: Kennon PortelaKim Gardner assistant Filed Vitals:   12/17/15 1432  BP: 134/84  Height: 5\' 4"  (1.626 m)  Weight: 210 lb (95.255 kg)   General appearance:  Normal affect, orientation and appearance. Skin: Grossly normal HEENT: Without gross lesions.  No cervical or supraclavicular adenopathy. Thyroid normal.  Lungs:  Clear without wheezing, rales or rhonchi Cardiac: RR, without RMG Abdominal:  Soft, nontender, without masses, guarding, rebound, organomegaly or hernia Breasts:  Examined lying and sitting without masses, retractions, discharge or axillary adenopathy. Pelvic:  Ext/BUS/vagina with atrophic changes. Symmetrical blanching of the skin from periclitoral to her anal region consistent with her history of lichen sclerosis.  Cervix atrophic  Uterus anteverted, normal size, shape and contour, midline and mobile nontender   Adnexa without masses or tenderness    Anus and perineum normal   Rectovaginal normal sphincter tone without palpated masses or tenderness.    Assessment/Plan:  60 y.o. 173P3003 female for annual exam.   1. Postmenopausal/atrophic genital changes. Without significant hot flushes, night sweats, vaginal dryness or any vaginal bleeding. Continue to monitor and report any issues or vaginal bleeding. 2. Lichen sclerosis. Has flare several times yearly for which she uses her clobetasol with good results. Will continue to do so and call when she needs refill. Will continue with self vulvar exams and report any lesions or changes. 3. Mammography  scheduled this week. SBE monthly reviewed. 4. Pap smear/HPV 02/2013 negative. No Pap smear done today.  No history of abnormal Pap smears previously. Repeat closer to 5 year interval per current screening guidelines. 5. Colonoscopy 2009 with reported repeat interval 10 years. 6. DEXA never. We will plan at age 60. Increased calcium vitamin D. 7. Health maintenance.  No routine lab work done as patient reports this done at her primary physician's office. Follow up 1 year, sooner as needed. 8.    [name],Anabeth Chilcott P MD, 2:55 PM 12/17/2015

## 2015-12-27 ENCOUNTER — Encounter: Payer: Self-pay | Admitting: Gynecology

## 2016-01-15 ENCOUNTER — Ambulatory Visit (INDEPENDENT_AMBULATORY_CARE_PROVIDER_SITE_OTHER): Payer: BLUE CROSS/BLUE SHIELD | Admitting: Podiatry

## 2016-01-15 ENCOUNTER — Encounter: Payer: Self-pay | Admitting: Podiatry

## 2016-01-15 VITALS — BP 119/74 | HR 62 | Resp 12

## 2016-01-15 DIAGNOSIS — M216X9 Other acquired deformities of unspecified foot: Secondary | ICD-10-CM | POA: Diagnosis not present

## 2016-01-15 DIAGNOSIS — Q828 Other specified congenital malformations of skin: Secondary | ICD-10-CM | POA: Diagnosis not present

## 2016-01-15 NOTE — Progress Notes (Signed)
   Subjective:    Patient ID: Meghan Reyes, female    DOB: May 19, 1956, 60 y.o.   MRN: [id number]  HPI: She presents today with a chief complaint of a painful callus the plantar aspect of the bilateral foot. She states that is really been here for months and months but I think trimming it down on a regular basis. I only came here after my husband encouraged me to do so.    Review of Systems  Skin: Positive for color change.       Objective:   Physical Exam: Vital signs are stable alert and oriented 3. Pulses are palpable. Neurologic sensorium is intact per Semmes-Weinstein monofilament. Deep tendon reflexes are intact bilateral and muscle strength is 5 over 5 dorsiflexion plantar flexors and inverters everters all into the musculature is intact. Orthopedic evaluation demonstrates all joints distal to the ankle for range of motion without crepitation. I also found reactive hyperkeratotic lesions beneath the second metatarsophalangeal joint with a diffuse tyloma. This porokeratotic lesions are exquisitely painful and do not demonstrate any signs of warts.        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  Plan: Debridement of the reactive hyperkeratoses today discussed the fact that these will more than likely recur and that we will possibly need to make her orthotics. I will follow up with her as needed.

## 2016-05-13 ENCOUNTER — Telehealth: Payer: Self-pay | Admitting: *Deleted

## 2016-05-13 MED ORDER — CLOBETASOL PROPIONATE 0.05 % EX CREA: TOPICAL_CREAM | Freq: Two times a day (BID) | CUTANEOUS | 0 refills | Status: DC

## 2016-05-13 NOTE — Telephone Encounter (Signed)
Pt has lichen sclerosis has been using clobetasol cream with good results, needs refill on Rx, now out. Okay to refill?

## 2016-05-13 NOTE — Telephone Encounter (Signed)
Okay for clobetasol 0.05% cream 60 mg apply daily as needed for irritation no refills

## 2016-05-13 NOTE — Telephone Encounter (Signed)
Rx sent, pt aware 

## 2016-05-13 NOTE — Telephone Encounter (Signed)
Prior authorization for clobetasol cream 0.05 % done via cover my meds, will wait for response.

## 2016-05-14 MED ORDER — CLOBETASOL PROP EMOLLIENT BASE 0.05 % EX CREA: 1.0000 g | TOPICAL_CREAM | Freq: Two times a day (BID) | CUTANEOUS | 0 refills | Status: DC

## 2016-05-14 NOTE — Telephone Encounter (Signed)
The below is correct, Rx sent.

## 2016-05-14 NOTE — Telephone Encounter (Signed)
If I understand your note correctly and all of those other listed creams our options then I would go with the clobetasol 0.05% emollient cream

## 2016-05-14 NOTE — Telephone Encounter (Signed)
Clobetasol cream was denied by insurance pt will to try formulary medication such as augmented betamethasone 0.05 % gel or ointment,clobetasol 0.05% emollient-cream or emollient ointment, halobetasol 0.05% cream or ointment. Please advise

## 2016-05-21 ENCOUNTER — Telehealth: Payer: Self-pay | Admitting: *Deleted

## 2016-05-21 MED ORDER — CLOBETASOL PROP EMOLLIENT BASE 0.05 % EX CREA: 1.0000 g | TOPICAL_CREAM | Freq: Two times a day (BID) | CUTANEOUS | 0 refills | Status: DC

## 2016-05-21 NOTE — Telephone Encounter (Signed)
Pt would like clobetasol pro emollient cream 0.05 % sent to [facility] in [place]. Rx sent.

## 2016-09-25 ENCOUNTER — Other Ambulatory Visit: Payer: Self-pay | Admitting: Gynecology

## 2016-10-02 ENCOUNTER — Telehealth: Payer: Self-pay | Admitting: *Deleted

## 2016-10-02 MED ORDER — BETAMETHASONE DIPROPIONATE AUG 0.05 % EX GEL: CUTANEOUS | 0 refills | Status: AC

## 2016-10-02 NOTE — Telephone Encounter (Signed)
[employer] is denied coverage for clobetasol 0.05 % emollient cream/ointment, patient will need to try formulary drugs such as augmented betamethasone 0.05% gel/ointment or Halobetasol 0.05% cream/ointment. Are either medication an option for patient? Please advise

## 2016-10-02 NOTE — Telephone Encounter (Signed)
Augmented betamethasone 0.05% gel ointment okay. The need to use sparingly to can control symptoms

## 2016-10-02 NOTE — Telephone Encounter (Signed)
New Rx sent.

## 2017-01-19 ENCOUNTER — Encounter: Payer: Self-pay | Admitting: Gynecology

## 2017-07-20 ENCOUNTER — Encounter: Payer: Self-pay | Admitting: Gynecology

## 2017-07-20 ENCOUNTER — Encounter: Payer: BLUE CROSS/BLUE SHIELD | Admitting: Gynecology

## 2017-07-20 ENCOUNTER — Ambulatory Visit (INDEPENDENT_AMBULATORY_CARE_PROVIDER_SITE_OTHER): Payer: BLUE CROSS/BLUE SHIELD | Admitting: Gynecology

## 2017-07-20 VITALS — BP 120/76 | Ht 64.0 in | Wt 195.0 lb

## 2017-07-20 DIAGNOSIS — L900 Lichen sclerosus et atrophicus: Secondary | ICD-10-CM | POA: Diagnosis not present

## 2017-07-20 DIAGNOSIS — Z01411 Encounter for gynecological examination (general) (routine) with abnormal findings: Secondary | ICD-10-CM | POA: Diagnosis not present

## 2017-07-20 DIAGNOSIS — N952 Postmenopausal atrophic vaginitis: Secondary | ICD-10-CM

## 2017-07-20 MED ORDER — CLOBETASOL PROPIONATE 0.05 % EX CREA: TOPICAL_CREAM | CUTANEOUS | 2 refills | Status: DC

## 2017-07-20 NOTE — Addendum Note (Signed)
Addended by: Dayna Barker on: 07/20/2017 01:03 PM   Modules accepted: Orders

## 2017-07-20 NOTE — Progress Notes (Signed)
    Meghan Reyes 11-11-1955 [id number]        61 y.o.  [id number] for annual gynecologic exam.  Is being followed for lichen sclerosis. Uses steroid cream intermittently with good results. Apparently was unable to get clobetasol on her drug coverage and is using diprolene but notes that this seems to wear off faster.  Past medical history,surgical history, problem list, medications, allergies, family history and social history were all reviewed and documented as reviewed in the EPIC chart.  ROS:  Performed with pertinent positives and negatives included in the history, assessment and plan.   Additional significant findings :  None   Exam: Kennon Portela assistant Vitals:   07/20/17 1213  BP: 120/76  Weight: 195 lb (88.5 kg)  Height:  (1.626 m)   Body mass index is 33.47 kg/m.  General appearance:  Normal affect, orientation and appearance. Skin: Grossly normal HEENT: Without gross lesions.  No cervical or supraclavicular adenopathy. Thyroid normal.  Lungs:  Clear without wheezing, rales or rhonchi Cardiac: RR, without RMG Abdominal:  Soft, nontender, without masses, guarding, rebound, organomegaly or hernia Breasts:  Examined lying and sitting without masses, retractions, discharge or axillary adenopathy. Pelvic:  Ext, BUS, Vagina: With atrophic changes. Symmetrical blanching of the skin from the periclitoral region to her anal region consistent with her lichen sclerosis.  Cervix: With atrophic changes. Pap smear/HPV  Uterus: Anteverted, normal size, shape and contour, midline and mobile nontender   Adnexa: Without masses or tenderness    Anus and perineum: Normal   Rectovaginal: Normal sphincter tone without palpated masses or tenderness.    Assessment/Plan:  61 y.o. G77P3003 female for annual gynecologic exam.   1. Postmenopausal/atrophic genital changes. No significant hot flushes, night sweats or any vaginal bleeding. Continue to monitor and report any issues or  bleeding. 2. Lichen sclerosis. Continues to use steroid cream intermittently.  Uses diprolene but not quite as effective as clobetasol. We'll go ahead back to clobetasol 0.05% cream nightly as needed for irritation. 30 g with 2 refills provided. 3. Mammography 01/2017. Continue with annual mammography when due. Breast exam normal today. 4. Pap smear/HPV 2014. Pap smear/HPV today. No history of significant abnormal Pap smears previously. 5. DEXA never. Will plan further into the menopause. 6. Colonoscopy 2017. Repeat at their recommended interval. 7. Health maintenance. No routine lab work done as patient reports is done elsewhere. Follow up 1 year, sooner as needed.   Dara Lords MD, 12:57 PM 07/20/2017

## 2017-07-20 NOTE — Patient Instructions (Signed)
Followup in one year for annual exam, sooner as needed. 

## 2017-07-21 ENCOUNTER — Telehealth: Payer: Self-pay | Admitting: *Deleted

## 2017-07-21 NOTE — Telephone Encounter (Signed)
PA done via cover my meds for temovate cream 0.05% will wait for response.

## 2017-07-22 LAB — PAP IG W/ RFLX HPV ASCU

## 2017-07-23 NOTE — Telephone Encounter (Signed)
Medication approved effective 07/21/17-07/20/18, [facility] aware as well.

## 2017-11-19 ENCOUNTER — Ambulatory Visit: Payer: BLUE CROSS/BLUE SHIELD | Admitting: Podiatry

## 2017-11-19 ENCOUNTER — Encounter: Payer: Self-pay | Admitting: Podiatry

## 2017-11-19 DIAGNOSIS — Q828 Other specified congenital malformations of skin: Secondary | ICD-10-CM

## 2017-11-22 NOTE — Progress Notes (Signed)
She states the abductus crazy calluses that are starting to hurt again.  Objective: Vital signs are stable alert and oriented x3.  Pulses are palpable.  Porokeratosis to the forefoot bilateral.  No open lesions or wounds.  Pulses remain palpable neurologic sensorium is intact.  Deep tendon reflexes are intact.  Muscle strength is normal.  Her graph assessment: Porokeratosis plantar aspect of the forefoot bilateral.  Plan: I debrided all reactive hyperkeratosis today placed padding.  Follow-up with her on an as-needed basis.

## 2018-03-10 ENCOUNTER — Other Ambulatory Visit: Payer: Self-pay | Admitting: Gynecology

## 2018-05-25 ENCOUNTER — Encounter (HOSPITAL_COMMUNITY): Payer: Self-pay | Admitting: Emergency Medicine

## 2018-05-25 ENCOUNTER — Emergency Department (HOSPITAL_COMMUNITY)
Admission: EM | Admit: 2018-05-25 | Discharge: 2018-05-25 | Disposition: A | Discharge status: Home / Self Care | Payer: BLUE CROSS/BLUE SHIELD | Attending: Emergency Medicine | Admitting: Emergency Medicine

## 2018-05-25 ENCOUNTER — Emergency Department (HOSPITAL_COMMUNITY): Payer: BLUE CROSS/BLUE SHIELD

## 2018-05-25 DIAGNOSIS — Z79899 Other long term (current) drug therapy: Secondary | ICD-10-CM | POA: Insufficient documentation

## 2018-05-25 DIAGNOSIS — I10 Essential (primary) hypertension: Secondary | ICD-10-CM | POA: Insufficient documentation

## 2018-05-25 DIAGNOSIS — R42 Dizziness and giddiness: Secondary | ICD-10-CM | POA: Diagnosis present

## 2018-05-25 LAB — COMPREHENSIVE METABOLIC PANEL
ALK PHOS: 69 U/L (ref 38–126)
ALT: 27 U/L (ref 0–44)
ANION GAP: 10 (ref 5–15)
AST: 28 U/L (ref 15–41)
Albumin: 4 g/dL (ref 3.5–5.0)
BUN: 13 mg/dL (ref 6–20)
CALCIUM: 9.5 mg/dL (ref 8.9–10.3)
CHLORIDE: 103 mmol/L (ref 98–111)
CO2: 27 mmol/L (ref 22–32)
Creatinine, Ser: 0.82 mg/dL (ref 0.44–1.00)
GFR calc non Af Amer: >60 mL/min (ref >60)
Glucose, Bld: 104 mg/dL — ABNORMAL HIGH (ref 70–99)
Potassium: 4.5 mmol/L (ref 3.5–5.1)
SODIUM: 140 mmol/L (ref 135–145)
Total Bilirubin: 1 mg/dL (ref 0.3–1.2)
Total Protein: 7.1 g/dL (ref 6.5–8.1)

## 2018-05-25 LAB — URINALYSIS, ROUTINE W REFLEX MICROSCOPIC
Bilirubin Urine: NEGATIVE
Glucose, UA: NEGATIVE mg/dL
Hgb urine dipstick: NEGATIVE
KETONES UR: 5 mg/dL — AB
LEUKOCYTES UA: NEGATIVE
NITRITE: NEGATIVE
PH: 6 (ref 5.0–8.0)
Protein, ur: NEGATIVE mg/dL
Specific Gravity, Urine: 1.006 (ref 1.005–1.030)

## 2018-05-25 LAB — CBC WITH DIFFERENTIAL/PLATELET
Abs Immature Granulocytes: 0 10*3/uL (ref 0.0–0.1)
BASOS ABS: 0.1 10*3/uL (ref 0.0–0.1)
Basophils Relative: 1 %
EOS PCT: 2 %
Eosinophils Absolute: 0.1 10*3/uL (ref 0.0–0.7)
HCT: 42 % (ref 36.0–46.0)
Hemoglobin: 13.4 g/dL (ref 12.0–15.0)
Immature Granulocytes: 0 %
Lymphocytes Relative: 33 %
Lymphs Abs: 3.1 10*3/uL (ref 0.7–4.0)
MCH: 29.8 pg (ref 26.0–34.0)
MCHC: 31.9 g/dL (ref 30.0–36.0)
MCV: 93.5 fL (ref 78.0–100.0)
MONO ABS: 0.7 10*3/uL (ref 0.1–1.0)
Monocytes Relative: 7 %
Neutro Abs: 5.2 10*3/uL (ref 1.7–7.7)
Neutrophils Relative %: 57 %
PLATELETS: 301 10*3/uL (ref 150–400)
RBC: 4.49 MIL/uL (ref 3.87–5.11)
RDW: 13.8 % (ref 11.5–15.5)
WBC: 9.2 10*3/uL (ref 4.0–10.5)

## 2018-05-25 MED ORDER — MECLIZINE HCL 25 MG PO TABS
25.0000 mg | ORAL_TABLET | Freq: Once | ORAL | Status: AC
  Administered 2018-05-25: 25 mg via ORAL
  Filled 2018-05-25: qty 1

## 2018-05-25 MED ORDER — MECLIZINE HCL 25 MG PO TABS: 25.0000 mg | ORAL_TABLET | Freq: Three times a day (TID) | ORAL | 0 refills | Status: AC | PRN

## 2018-05-25 MED ORDER — LACTATED RINGERS IV BOLUS
1000.0000 mL | Freq: Once | INTRAVENOUS | Status: AC
  Administered 2018-05-25: 1000 mL via INTRAVENOUS

## 2018-05-25 NOTE — Discharge Instructions (Addendum)
Haifa K Yurkovich:  Thank you for allowing us to take care of you today.  We hope you begin feeling better soon.  To-Do: Please follow-up with Neurology Take Antivert as prescribed Please return to the Emergency Department or call 911 if you experience chest pain, shortness of breath, severe pain, severe fever, altered mental status, or have any reason to think that you need emergency medical care.  Thank you again.  Hope you feel better soon.

## 2018-05-25 NOTE — ED Triage Notes (Signed)
Pt arrives to the ED with "dizziness" pt reports being treated for vertigo for the last 3 weeks and is currently on Antivert. Pt states this is not helping. Pt reports this dizziness has been intermittent except since Sunday has been constant. No weakness noted, speech clear, alert and 0x4.

## 2018-05-25 NOTE — ED Provider Notes (Signed)
Patient placed in Quick Look pathway, seen and evaluated   Chief Complaint: Dizziness  HPI: Pt reports she was diagnosed with vertigo 3 weeks ago.  Pt reports it is getting worse,  ROS: difficulty walking, nausea  Physical Exam:   Gen: No distress  Neuro: Awake and Alert  Skin: Warm    Focused Exam: Lungs normal resp rate. Heart normal heart rate   Initiation of care has begun. The patient has been counseled on the process, plan, and necessity for staying for the completion/evaluation, and the remainder of the medical screening examination   Osie CheeksSofia, Leslie K, PA-C 05/25/18 1341    Derwood KaplanNanavati, Ankit, MD 05/28/18 1043

## 2018-05-25 NOTE — ED Notes (Signed)
Pt's dizziness is made better with certain positions of holding her head.

## 2018-05-25 NOTE — ED Provider Notes (Signed)
[facility] EMERGENCY DEPARTMENT Provider Note   CSN: [id number] Arrival date & time: 05/25/18  1317     History   Chief Complaint Chief Complaint  Patient presents with  . Dizziness    HPI Meghan Blowers Baldyga is a 62 y.o. female with hypertension and ITP who presents due to worsening dizziness.  She reports that she has had intermittent episodes of vertigo since December.  She says that normally Antivert controls her symptoms.  However, since Sunday, Antivert has not alleviated her symptoms and her dizziness now feels different than it normally does.  She says that she at times feels off balance and she says that she has to hold her head in a certain way otherwise it provokes her symptoms.  She has never felt this way before.  She denies numbness, tingling, and motor weakness.  She denies chest pain or shortness of breath.  She denies recent fever and illness.  She denies vision changes.  She has no history of autoimmune diseases that she is aware of.  However, she is adopted.  She denies difficulty with urination.  There have been no speech changes.  She says that she has not felt nauseous since Sunday, which is unusual for her vertigo.  She also says that she just switched from using prescription Antivert to over-the-counter Antivert.  HPI  Past Medical History:  Diagnosis Date  . Hypertension   . ITP (idiopathic thrombocytopenic purpura)   . Lichen sclerosus 2016   vulvar biopsy "suggests" lichen sclerosus  . Need for hepatitis C screening test 09/2014   NEGATIVE    Patient Active Problem List   Diagnosis Date Noted  . ITP (idiopathic thrombocytopenic purpura)   . Hypertension     Past Surgical History:  Procedure Laterality Date  . CHOLECYSTECOMY    . HER OPTION CRYOABLATIION  11/11  . SPLEENECTOMY    . stomach stapled    . TONSILLECTOMY    . TUBAL LIGATION       OB History    Gravida  3   Para  3   Term  3   Preterm      AB      Living  3      SAB      TAB      Ectopic      Multiple      Live Births               Home Medications    Prior to Admission medications   Medication Sig Start Date End Date Taking? Authorizing Provider  Cholecalciferol (VITAMIN D-3) 5000 UNITS TABS Take 5,000 Units by mouth daily.    Yes [provider]  clobetasol cream (TEMOVATE) 0.05 % Apply to affected area  at bedtime as needed for irritation 03/10/18  Yes Fontaine, Nadyne Coombes, MD  Cyanocobalamin (VITAMIN B-12 PO) Take 1 capsule by mouth daily.    Yes [provider]  Fe Fum-FePoly-Vit C-Vit B3 (INTEGRA PO) Take 1 capsule by mouth daily.    Yes [provider]  LORazepam (ATIVAN) 1 MG tablet Take 1 tablet (1 mg total) by mouth every 8 (eight) hours. Patient taking differently: Take 1 mg by mouth every 8 (eight) hours as needed for anxiety.  02/28/15  Yes Fontaine, Nadyne Coombes, MD  losartan-hydrochlorothiazide (HYZAAR) 100-12.5 MG tablet Take 1 tablet by mouth daily.   Yes [provider]  Multiple Vitamin (MULTIVITAMIN) tablet Take 1 tablet by mouth daily.  Yes [provider]  pantoprazole (PROTONIX) 40 MG tablet Take 40 mg by mouth daily.   Yes [provider]  betamethasone, augmented, (DIPROLENE) 0.05 % gel Apply topically twice daily as needed, use sparingly Patient not taking: Reported on 05/25/2018 10/02/16   Fontaine, Nadyne Coombes, MD  meclizine (ANTIVERT) 25 MG tablet Take 1 tablet (25 mg total) by mouth 3 (three) times daily as needed for dizziness. 05/25/18   Talitha Givens, MD    Family History Family History  Adopted: Yes  Family history unknown: Yes    Social History Social History   Tobacco Use  . Smoking status: Never Smoker  . Smokeless tobacco: Never Used  Substance Use Topics  . Alcohol use: No    Alcohol/week: 0.0 standard drinks  . Drug use: No     Allergies   Penicillins   Review of Systems Review of Systems Review of Systems   Constitutional   Negative for fever  Negative for chills  HENT  Negative for ear pain  Negative for sore throat  Negative for difficultly swallowing  Eyes  Negative for eye pain  Negative for visual disturbance  Respiratory  Negative for shortness of breath  Negative for cough  CV  Negative for chest pain  Negative for leg swelling  Abdomen  Negative for abdominal pain  Negative for nausea  Negative for vomiting  MSK  Negative for extremity pain  Negative for back pain  Skin  Negative for rash  Negative for wound  Neuro  Negative for syncope  Negative for difficultly speaking  +dizzy  Psych  Negative for confusion   The remainder of the ROS was reviewed and negative except as documented above.      Physical Exam Updated Vital Signs BP 134/64   Pulse (!) 44   Temp 97.8 F (36.6 C)   Resp 11   LMP 11/24/2012   SpO2 98%   Physical Exam Physical Exam Constitutional  Nursing notes reviewed  Vital signs reviewed  HEENT  No obvious trauma  Supple without meningismus, mass, or overt JVD  EOMI  No scleral icterus or injection  Respiratory  Effort normal  CTAB  No respiratory distress  CV  Bradycardic rate  No obvious murmurs  No pitting edema  Abdomen  Soft  Non-tender  Non-distended  No peritonitis  MSK  Atraumatic  No obvious deformity  ROM appropriate  Skin  Warm  Dry  Neuro Component Findings  Mental Status Exam Alert and oriented Memory appropriate  Cranial Nerves   CN II Visual fields intact to confrontation  CN III, IV, VI PERRL EOMI No nystagmus.   CN V Facial sensation is normal No weakness of masticatory muscles  CN VII No facial weakness or asymmetry  CN VIII Auditory acuity grossly normal  CN IX and X Uvula is midline Palate elevates symmetrically  CN XI  Normal sternocleidomastoid and trapezius strength  CN XII The tongue is midline No tongue atrophy or fasciculations  Motor   Muscle Strength RUE: 5/5  flexion and extension RLE: 5/5 flexion and extension LUE: 5/5 flexion and extension LLE: 5/5 flexion and extension  No pronation or drift  Muscle Tone Normal bulk and tone  Coordination Intact finger-to-nose No tremor Negative Romberg  Sensation Intact to light touch  Gait Routine gait normal Normal heel to toe        Psychiatric  Mood and affect normal        ED Treatments / Results  Labs (all  labs ordered are listed, but only abnormal results are displayed) Labs Reviewed  COMPREHENSIVE METABOLIC PANEL - Abnormal; Notable for the following components:      Result Value   Glucose, Bld 104 (*)    All other components within normal limits  URINALYSIS, ROUTINE W REFLEX MICROSCOPIC - Abnormal; Notable for the following components:   Ketones, ur 5 (*)    All other components within normal limits  CBC WITH DIFFERENTIAL/PLATELET    EKG EKG Interpretation  Date/Time:  Tuesday May 25 2018 14:23:00 EDT Ventricular Rate:  48 PR Interval:    QRS Duration: 95 QT Interval:  458 QTC Calculation: 410 R Axis:   86 Text Interpretation:  Sinus bradycardia No acute changes No significant change since last tracing Confirmed by Derwood KaplanNanavati, Ankit [phone]([id number]) on 05/25/2018 3:06:58 PM   Radiology Ct Head Wo Contrast  Result Date: 05/25/2018 CLINICAL DATA:  Intermittent dizziness/vertigo EXAM: CT HEAD WITHOUT CONTRAST TECHNIQUE: Contiguous axial images were obtained from the base of the skull through the vertex without intravenous contrast. COMPARISON:  None. FINDINGS: Brain: The ventricles are normal in size and configuration. There is no intracranial mass, hemorrhage, extra-axial fluid collection, or midline shift. The gray-white compartments appear within normal limits. No evident acute infarct. Vascular: No evident hyperdense vessel. There is slight calcification in each cavernous carotid artery region. Skull: Bony calvarium appears intact. Sinuses/Orbits: There is slight mucosal  thickening in several ethmoid air cells. Other visualized paranasal sinuses are clear. Orbits appear symmetric bilaterally. Other: Mastoid air cells are clear. IMPRESSION: No mass or hemorrhage. Gray-white compartments are normal. There is slight arterial vascular calcification. There is slight mucosal thickening in several ethmoid air cells. Electronically Signed   By: Bretta BangWilliam  Woodruff III M.D.   On: 05/25/2018 14:11    Procedures Procedures (including critical care time)  Medications Ordered in ED Medications  lactated ringers bolus 1,000 mL (0 mLs Intravenous Stopped 05/25/18 1720)  meclizine (ANTIVERT) tablet 25 mg (25 mg Oral Given 05/25/18 1613)     Initial Impression / Assessment and Plan / ED Course  I have reviewed the triage vital signs and the nursing notes.  Pertinent labs & imaging results that were available during my care of the patient were reviewed by me and considered in my medical decision making (see chart for details).  Clinical Course as of May 26 2219  Tue May 25, 2018  1513 Keandra K Kolenovic presents due to a new feeling of dizziness that is different from her typical vertigo as per above.  Her neurological exam is normal.  She can ambulate without difficulty.  There is no nystagmus.  I was unable to solicit nystagmus with Dix-Hallpike maneuver.  She has no significant abnormalities on CBC or CMP.  CT head revealed no acute intracranial abnormalities.  Her ECG reveals a sinus bradycardia with a rate of 48 but otherwise shows no ischemic changes. She denies chest pain and shortness of breath.   [NA]  2217 She was given 1 L of LR and Antivert in the ED.  Upon reassessment, her heart rate was 66.  She denied lightheadedness, chest pain, or shortness of breath when ambulating.  She says that she continues to feel dizzy.  However, she was able to ambulate without any difficulty.  I have a very low suspicion that this is a central process vertigo given her completely normal  neurologic exam and normal head CT.  The chronicity of her symptoms, her symptoms being provoked by positional changes, and her symptoms  being paroxysmal are consistent with BPPV.  I do not think that she has a carotid or vertebral dissection or posterior circulation stroke.She herself believes that her over-the-counter Antivert may not be working as well as her former prescription Antivert.  I believe that she is safe for outpatient follow-up.  She agreed to see neurology.  I provided neurology follow-up resources and discharged her with a prescription for Antivert.  I provided ED return precautions.   [NA]    Clinical Course User Index [NA] Talitha GivensAshburn, Murphy Bundick, MD      Final Clinical Impressions(s) / ED Diagnoses   Final diagnoses:  Vertigo    ED Discharge Orders         Ordered    meclizine (ANTIVERT) 25 MG tablet  3 times daily PRN     05/25/18 1721           Talitha GivensAshburn, Cashay Manganelli, MD 05/25/18 2220    Blane OharaZavitz, Joshua, MD 05/31/18 [id number]

## 2018-05-26 ENCOUNTER — Encounter: Payer: Self-pay | Admitting: Neurology

## 2018-06-10 NOTE — Progress Notes (Signed)
NEUROLOGY CONSULTATION NOTE  Devra Stare Engelstad MRN: [id number] DOB: [birth year]  Referring provider: Blane Ohara, MD Primary care provider: Feliciana Rossetti, MD  Reason for consult:  dizziness  HISTORY OF PRESENT ILLNESS: Meghan Reyes is a 62 year old right-handed female with hypertension, ITP who presents for vertigo.  History supplemented by ED and PCP notes.  In December 2018, she developed episode of dizziness described as room spinning sensation with nausea and vomiting.  This was typical to her past vertigo.  She responded to meclizine.  Periodically, she may have a recurrent spell.  It occurred again in May 2019 and responded to meclizine. In late July 2019, she had another spell but she said it was different to her prior vertigo.  When she would slowly move her head or stand slow or just slightly, otherwise she has a sensation that she is moving and fall but the room isn't spinning and there is no associate nausea and vomiting.  There is no associated headache or neck pain (although neck feels stiff), double vision, tinnitus or unilateral numbness or weakness.  They were no longer responding to vertigo.  It occurred everyday for 3 weeks.  She went to urgent care who recommended that she get an MRI but she did not go to the ED.  She followed up with her PCP on 05/11/18, who diagnosed her with benign paroxysmal positional vertigo.  Plan was to see about going to vestibular rehab.  She was evaluated in the ED at [facility] on 05/25/18.  CT head without contrast was personally reviewed and was unremarkable.  She was diagnosed with benign paroxysmal positional vertigo and referred to neurology.  It resolved 3 days ago.    For years, she took antihypertensive medications.  When she went to the doctor in June, her blood pressure was low (112/65) and she was advised to stop her losartan.  She has been of of it for a month.  PAST MEDICAL HISTORY: Past Medical History:  Diagnosis Date  .  Hypertension   . ITP (idiopathic thrombocytopenic purpura)   . Lichen sclerosus 2016   vulvar biopsy "suggests" lichen sclerosus  . Need for hepatitis C screening test 09/2014   NEGATIVE    PAST SURGICAL HISTORY: Past Surgical History:  Procedure Laterality Date  . CHOLECYSTECOMY    . HER OPTION CRYOABLATIION  11/11  . SPLEENECTOMY    . stomach stapled    . TONSILLECTOMY    . TUBAL LIGATION      MEDICATIONS: Current Outpatient Medications on File Prior to Visit  Medication Sig Dispense Refill  . betamethasone, augmented, (DIPROLENE) 0.05 % gel Apply topically twice daily as needed, use sparingly (Patient not taking: Reported on 05/25/2018) 30 g 0  . Cholecalciferol (VITAMIN D-3) 5000 UNITS TABS Take 5,000 Units by mouth daily.     . clobetasol cream (TEMOVATE) 0.05 % Apply to affected area  at bedtime as needed for irritation 30 g 2  . Cyanocobalamin (VITAMIN B-12 PO) Take 1 capsule by mouth daily.     . Fe Fum-FePoly-Vit C-Vit B3 (INTEGRA PO) Take 1 capsule by mouth daily.     Marland Kitchen LORazepam (ATIVAN) 1 MG tablet Take 1 tablet (1 mg total) by mouth every 8 (eight) hours. (Patient taking differently: Take 1 mg by mouth every 8 (eight) hours as needed for anxiety. ) 20 tablet 0  . losartan-hydrochlorothiazide (HYZAAR) 100-12.5 MG tablet Take 1 tablet by mouth daily.    . meclizine (ANTIVERT) 25 MG  tablet Take 1 tablet (25 mg total) by mouth 3 (three) times daily as needed for dizziness. 30 tablet 0  . Multiple Vitamin (MULTIVITAMIN) tablet Take 1 tablet by mouth daily.    . pantoprazole (PROTONIX) 40 MG tablet Take 40 mg by mouth daily.     No current facility-administered medications on file prior to visit.     ALLERGIES: Allergies  Allergen Reactions  . Penicillins Swelling    FAMILY HISTORY: Family History  Adopted: Yes  Family history unknown: Yes   SOCIAL HISTORY: Social History   Socioeconomic History  . Marital status: Married    Spouse name: Not on file  .  Number of children: Not on file  . Years of education: Not on file  . Highest education level: Not on file  Occupational History  . Not on file  Social Needs  . Financial resource strain: Not on file  . Food insecurity:    Worry: Not on file    Inability: Not on file  . Transportation needs:    Medical: Not on file    Non-medical: Not on file  Tobacco Use  . Smoking status: Never Smoker  . Smokeless tobacco: Never Used  Substance and Sexual Activity  . Alcohol use: No    Alcohol/week: 0.0 standard drinks  . Drug use: No  . Sexual activity: Yes    Birth control/protection: Surgical    Comment: BTL-1st intercourse 62 yo-More than 5 partners  Lifestyle  . Physical activity:    Days per week: Not on file    Minutes per session: Not on file  . Stress: Not on file  Relationships  . Social connections:    Talks on phone: Not on file    Gets together: Not on file    Attends religious service: Not on file    Active member of club or organization: Not on file    Attends meetings of clubs or organizations: Not on file    Relationship status: Not on file  . Intimate partner violence:    Fear of current or ex partner: Not on file    Emotionally abused: Not on file    Physically abused: Not on file    Forced sexual activity: Not on file  Other Topics Concern  . Not on file  Social History Narrative  . Not on file    REVIEW OF SYSTEMS: Constitutional: No fevers, chills, or sweats, no generalized fatigue, change in appetite Eyes: No visual changes, double vision, eye pain Ear, nose and throat: No hearing loss, ear pain, nasal congestion, sore throat Cardiovascular: No chest pain, palpitations Respiratory:  No shortness of breath at rest or with exertion, wheezes GastrointestinaI: No nausea, vomiting, diarrhea, abdominal pain, fecal incontinence Genitourinary:  No dysuria, urinary retention or frequency Musculoskeletal:  No neck pain, back pain Integumentary: No rash, pruritus,  skin lesions Neurological: as above Psychiatric: No depression, insomnia, anxiety Endocrine: No palpitations, fatigue, diaphoresis, mood swings, change in appetite, change in weight, increased thirst Hematologic/Lymphatic:  No purpura, petechiae. Allergic/Immunologic: no itchy/runny eyes, nasal congestion, recent allergic reactions, rashes  PHYSICAL EXAM: Blood pressure 112/82, pulse 67, weight 193 lb (87.5 kg), last menstrual period 11/24/2012. General: No acute distress.  Patient appears well-groomed.  Head:  Normocephalic/atraumatic Eyes:  fundi examined but not visualized Neck: supple, no paraspinal tenderness, full range of motion Back: No paraspinal tenderness Heart: regular rate and rhythm Lungs: Clear to auscultation bilaterally. Vascular: No carotid bruits. Neurological Exam: Mental status: alert and oriented to  person, place, and time, recent and remote memory intact, fund of knowledge intact, attention and concentration intact, speech fluent and not dysarthric, language intact. Cranial nerves: CN I: not tested CN II: pupils equal, round and reactive to light, visual fields intact CN III, IV, VI:  full range of motion, no nystagmus, no ptosis CN V: facial sensation intact CN VII: upper and lower face symmetric CN VIII: hearing intact CN IX, X: gag intact, uvula midline CN XI: sternocleidomastoid and trapezius muscles intact CN XII: tongue midline Bulk & Tone: normal, no fasciculations. Motor:  5/5 throughout  Sensation: temperature and vibration sensation intact. Deep Tendon Reflexes:  2+ throughout, toes downgoing.  Finger to nose testing:  Without dysmetria.  Heel to shin:  Without dysmetria.  Gait:  Normal station and stride.  Able to turn and tandem walk. Romberg negative. Dix-Hallpike negative  IMPRESSION: Vertigo.  Probable benign paroxysmal positional vertigo but given the change in semiology, extended length and poor response to meclizine, will pursue imaging  to rule out intracranial etiology/vertebrobasilar insufficiency  PLAN: 1.  MRI brain/internal auditory canal with and without contrast and MRA of head 2.  Further recommendations pending results.    Thank you for allowing me to take part in the care of this patient.  Shon MilletAdam Shalon Salado, DO  CC: Feliciana RossettiGreg Grisso, MD

## 2018-06-11 ENCOUNTER — Encounter: Payer: Self-pay | Admitting: Neurology

## 2018-06-11 ENCOUNTER — Ambulatory Visit: Payer: BLUE CROSS/BLUE SHIELD | Admitting: Neurology

## 2018-06-11 VITALS — BP 112/82 | HR 67 | Wt 193.0 lb

## 2018-06-11 DIAGNOSIS — R42 Dizziness and giddiness: Secondary | ICD-10-CM | POA: Diagnosis not present

## 2018-06-11 DIAGNOSIS — G450 Vertebro-basilar artery syndrome: Secondary | ICD-10-CM

## 2018-06-11 NOTE — Patient Instructions (Addendum)
1.  We will check MRI of brain/internal auditory canal with and without contrast and MRA of head 2.  Further recommendations pending results 3.  If vertigo returns, then recommend the physical therapy/exercises  [facility] and [facility] and they will call you directly to schedule your appt. They are located at [address] If you need to contact them directly please call [phone].

## 2018-06-21 ENCOUNTER — Ambulatory Visit: Payer: BLUE CROSS/BLUE SHIELD | Admitting: Neurology

## 2018-06-21 ENCOUNTER — Encounter

## 2018-06-26 ENCOUNTER — Ambulatory Visit
Admission: RE | Admit: 2018-06-26 | Discharge: 2018-06-26 | Disposition: A | Discharge status: Home / Self Care | Payer: BLUE CROSS/BLUE SHIELD | Source: Ambulatory Visit | Attending: Neurology | Admitting: Neurology

## 2018-06-26 DIAGNOSIS — G450 Vertebro-basilar artery syndrome: Secondary | ICD-10-CM

## 2018-06-26 DIAGNOSIS — R42 Dizziness and giddiness: Secondary | ICD-10-CM

## 2018-06-26 MED ORDER — GADOBENATE DIMEGLUMINE 529 MG/ML IV SOLN: 18.0000 mL | Freq: Once | INTRAVENOUS | Status: DC | PRN

## 2018-06-26 MED ORDER — GADOBENATE DIMEGLUMINE 529 MG/ML IV SOLN
18.0000 mL | Freq: Once | INTRAVENOUS | Status: AC | PRN
  Administered 2018-06-26: 18 mL via INTRAVENOUS

## 2018-07-02 ENCOUNTER — Telehealth: Payer: Self-pay

## 2018-07-02 NOTE — Telephone Encounter (Signed)
Called and advised Pt 

## 2018-07-02 NOTE — Telephone Encounter (Signed)
-----   Message from Drema DallasAdam R Jaffe, DO sent at 06/28/2018  9:49 AM EDT ----- MRI/MRA of head look okay.  The dizziness is likely inner-ear issue rather than neurologic.

## 2018-07-22 ENCOUNTER — Encounter: Payer: BLUE CROSS/BLUE SHIELD | Admitting: Gynecology

## 2018-07-27 ENCOUNTER — Encounter: Payer: BLUE CROSS/BLUE SHIELD | Admitting: Gynecology

## 2018-08-02 ENCOUNTER — Encounter: Payer: BLUE CROSS/BLUE SHIELD | Admitting: Gynecology

## 2018-09-24 ENCOUNTER — Encounter: Payer: Self-pay | Admitting: Gynecology

## 2018-09-24 ENCOUNTER — Telehealth: Payer: Self-pay | Admitting: *Deleted

## 2018-09-24 ENCOUNTER — Ambulatory Visit: Payer: BLUE CROSS/BLUE SHIELD | Admitting: Gynecology

## 2018-09-24 ENCOUNTER — Encounter (INDEPENDENT_AMBULATORY_CARE_PROVIDER_SITE_OTHER): Payer: Self-pay

## 2018-09-24 VITALS — BP 124/80 | Ht 64.0 in | Wt 195.0 lb

## 2018-09-24 DIAGNOSIS — Z01419 Encounter for gynecological examination (general) (routine) without abnormal findings: Secondary | ICD-10-CM

## 2018-09-24 DIAGNOSIS — L900 Lichen sclerosus et atrophicus: Secondary | ICD-10-CM

## 2018-09-24 DIAGNOSIS — N952 Postmenopausal atrophic vaginitis: Secondary | ICD-10-CM

## 2018-09-24 LAB — WET PREP FOR TRICH, YEAST, CLUE

## 2018-09-24 MED ORDER — CLOBETASOL PROPIONATE 0.05 % EX CREA: TOPICAL_CREAM | CUTANEOUS | 2 refills | Status: AC

## 2018-09-24 MED ORDER — NYSTATIN 100000 UNIT/GM EX CREA: 1.0000 "application " | TOPICAL_CREAM | Freq: Two times a day (BID) | CUTANEOUS | 0 refills | Status: AC

## 2018-09-24 NOTE — Addendum Note (Signed)
Addended by: GARDNER, KIMBERLY K on: 09/24/2018 01:06 PM   Modules accepted: Orders  

## 2018-09-24 NOTE — Patient Instructions (Addendum)
Schedule and follow-up for bone density.

## 2018-09-24 NOTE — Progress Notes (Signed)
    Genelle BalLucinda K Lafontaine 02-27-1956 [id number]        62 y.o.  [id number] for annual gynecologic exam.  History of lichen sclerosis using Temovate intermittently with good results.  Past medical history,surgical history, problem list, medications, allergies, family history and social history were all reviewed and documented as reviewed in the EPIC chart.  ROS:  Performed with pertinent positives and negatives included in the history, assessment and plan.   Additional significant findings : None   Exam: Kennon PortelaKim Gardner assistant Vitals:   09/24/18 1031  BP: 124/80  Weight: 195 lb (88.5 kg)  Height: 5\' 4"  (1.626 m)   Body mass index is 33.47 kg/m.  General appearance:  Normal affect, orientation and appearance. Skin: Grossly normal HEENT: Without gross lesions.  No cervical or supraclavicular adenopathy. Thyroid normal.  Lungs:  Clear without wheezing, rales or rhonchi Cardiac: RR, without RMG Abdominal:  Soft, nontender, without masses, guarding, rebound, organomegaly or hernia Breasts:  Examined lying and sitting without masses, retractions, discharge or axillary adenopathy. Pelvic:  Ext, BUS, Vagina: With symmetrical vulvitis from the periclitoral region to the perianal region.  Some blanching of the skin to suggest lichen sclerosus  Cervix: With atrophic changes  Uterus: Anteverted, normal size, shape and contour, midline and mobile nontender   Adnexa: Without masses or tenderness    Anus and perineum: Normal   Rectovaginal: Normal sphincter tone without palpated masses or tenderness.    Assessment/Plan:  62 y.o. [id number] female for annual gynecologic exam.   1. Lichen sclerosus.  Biopsy proven.  Does have more of a inflammatory response suggesting yeast.  Wet prep was negative.  I am going to cover her with nystatin cream twice daily for the next week or so to see if this does not clear up some of the background inflammation.  Otherwise she will continue on her Temovate intermittently.   She uses it about every month to every other month for about a week with good results.  30 g tube with 2 refills provided. 2. Mammography 01/2018.  Continue with annual mammography when due.  Breast exam normal today. 3. Pap smear 2018.  No Pap smear done today.  No history of significant abnormal Pap smears.  Plan repeat Pap smear at 3-year interval per current screening guidelines. 4. DEXA never.  Recommended baseline DEXA now at age 62 and patient will schedule in follow-up for this. 5. Colonoscopy 2017.  Repeat at their recommended interval. 6. Health maintenance.  No routine lab work done as patient does this elsewhere.  Follow-up 1 year, sooner as needed.   Dara Lordsimothy P Fontaine MD, 10:53 AM 09/24/2018

## 2018-09-24 NOTE — Telephone Encounter (Signed)
Prior authorization done via cover my meds for clobetasol cream 0.05% will wait for response.

## 2018-09-29 ENCOUNTER — Encounter: Payer: Self-pay | Admitting: Gynecology

## 2018-09-29 ENCOUNTER — Ambulatory Visit (INDEPENDENT_AMBULATORY_CARE_PROVIDER_SITE_OTHER): Payer: BLUE CROSS/BLUE SHIELD

## 2018-09-29 DIAGNOSIS — Z13820 Encounter for screening for osteoporosis: Secondary | ICD-10-CM | POA: Diagnosis not present

## 2018-09-29 DIAGNOSIS — Z780 Asymptomatic menopausal state: Secondary | ICD-10-CM

## 2018-09-29 DIAGNOSIS — Z01419 Encounter for gynecological examination (general) (routine) without abnormal findings: Secondary | ICD-10-CM

## 2018-09-29 NOTE — Telephone Encounter (Signed)
Medication approved effective from 09/24/2018 through 09/23/2019.

## 2018-09-30 ENCOUNTER — Other Ambulatory Visit: Payer: Self-pay | Admitting: Gynecology

## 2018-09-30 DIAGNOSIS — Z780 Asymptomatic menopausal state: Secondary | ICD-10-CM

## 2018-09-30 DIAGNOSIS — Z13820 Encounter for screening for osteoporosis: Secondary | ICD-10-CM

## 2019-07-12 ENCOUNTER — Encounter: Payer: Self-pay | Admitting: Gynecology

## 2019-09-30 ENCOUNTER — Encounter: Payer: BLUE CROSS/BLUE SHIELD | Admitting: Gynecology

## 2020-03-02 IMAGING — CT CT HEAD W/O CM
4 series · 17 of 47 positions shown, 19 images · non-contrast
Comparison: None.

CLINICAL DATA: Intermittent dizziness/vertigo

EXAM:
CT HEAD WITHOUT CONTRAST
TECHNIQUE: Contiguous axial images were obtained from the base of the skull
through the vertex without intravenous contrast.

[Series 3: head wo · axial · 0.44mm/px · z∈[-104,+16]mm · 7 of 33 slices shown, 9 images]
[im 5/33  brain]
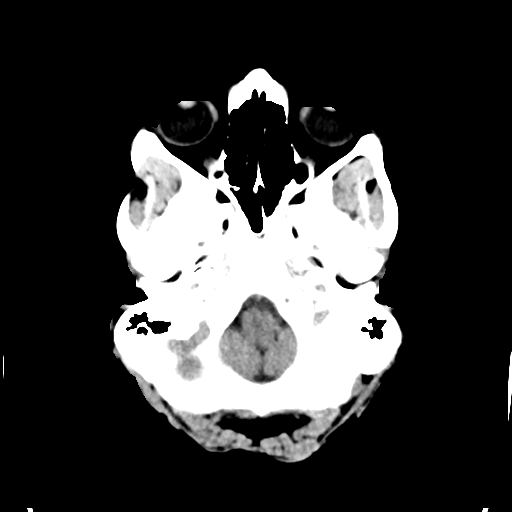
[im 5/33  bone]
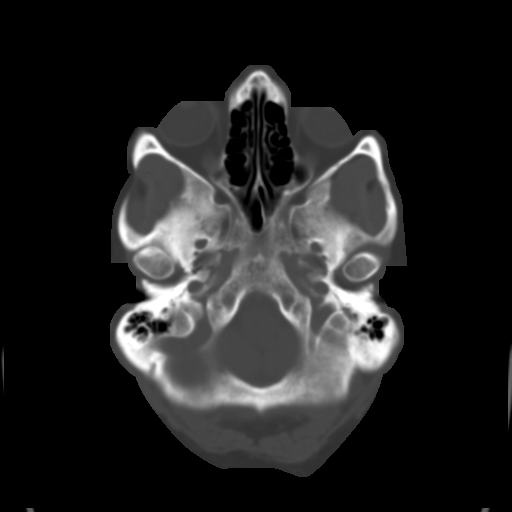
[im 9/33  brain]
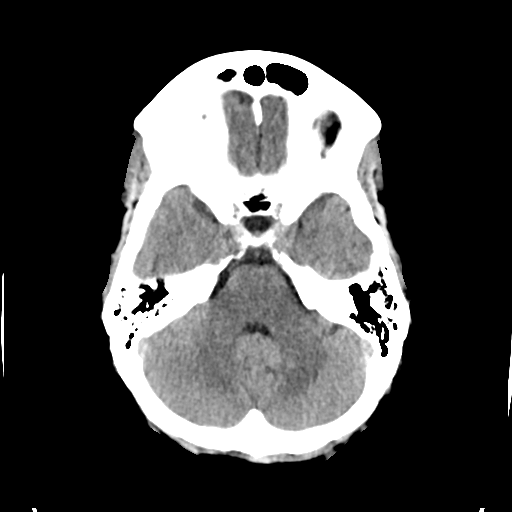
[im 13/33  brain]
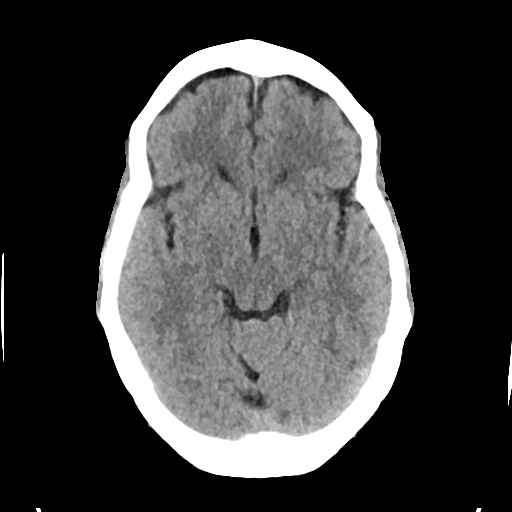
[im 17/33  brain]
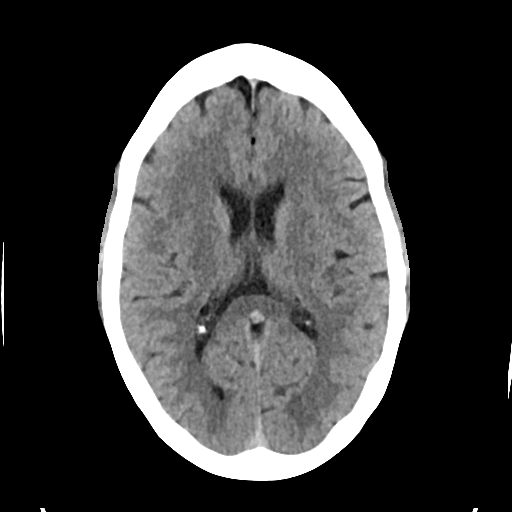
[im 21/33  brain]
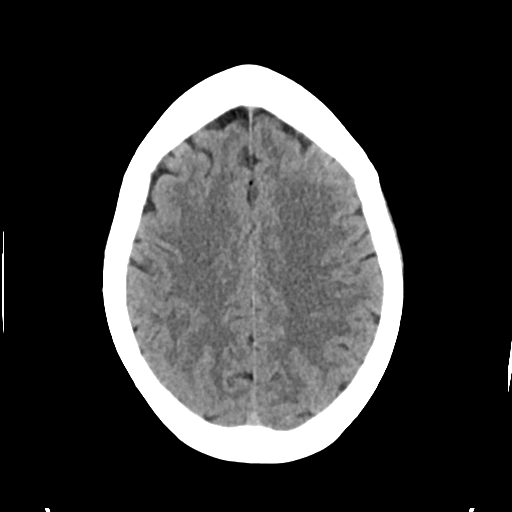
[im 21/33  bone]
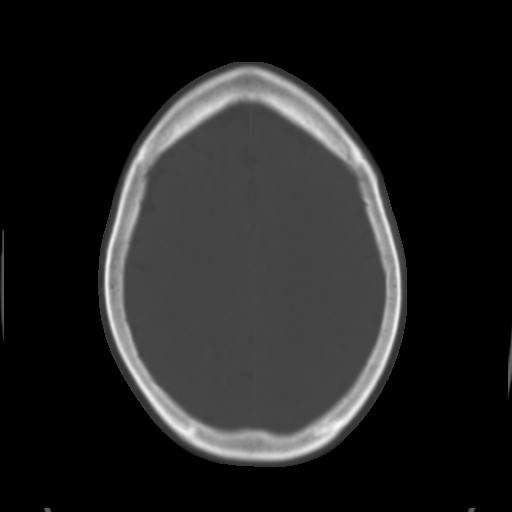
[im 25/33  brain]
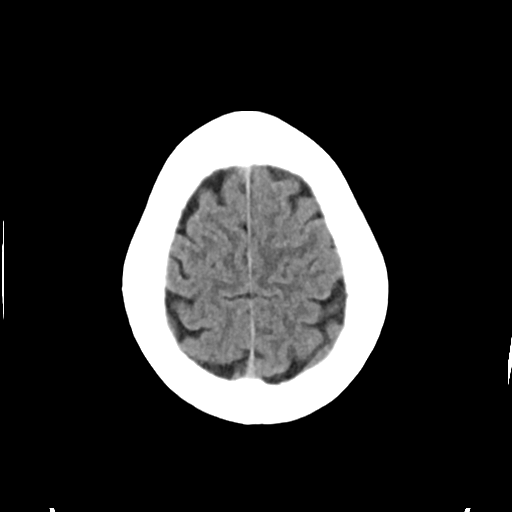
[im 29/33  brain]
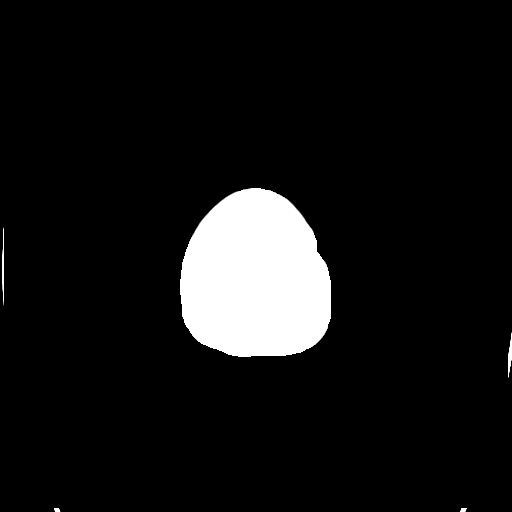

[Series 4: head bone · axial · 0.44mm/px · z∈[-108,-52]mm · 4 of 81 slices shown]
[im 9/81  bone]
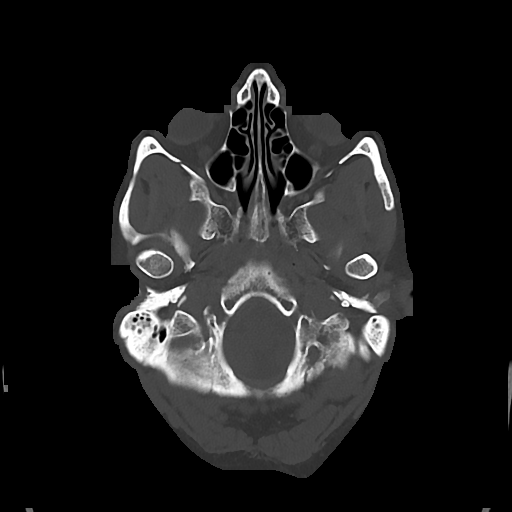
[im 17/81  bone]
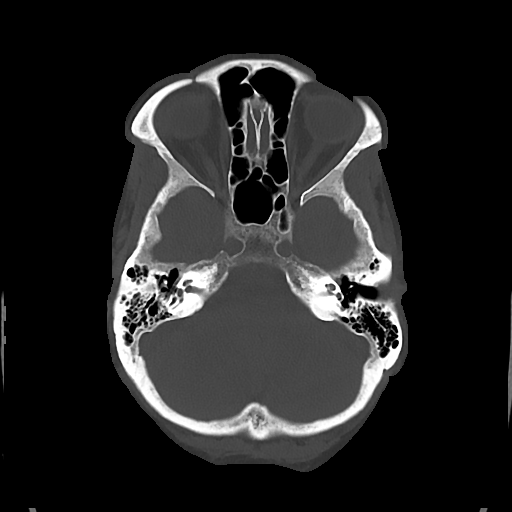
[im 25/81  bone]
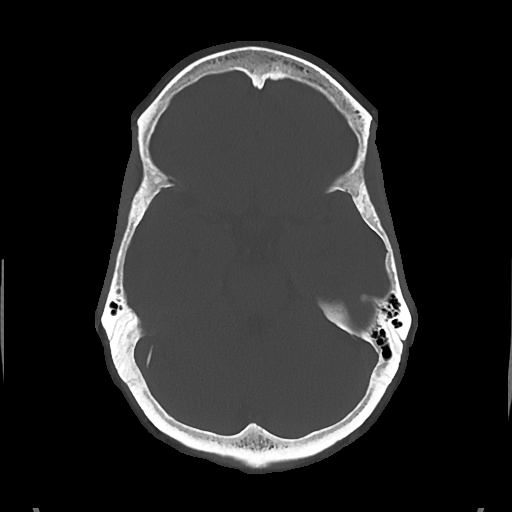
[im 37/81  bone]
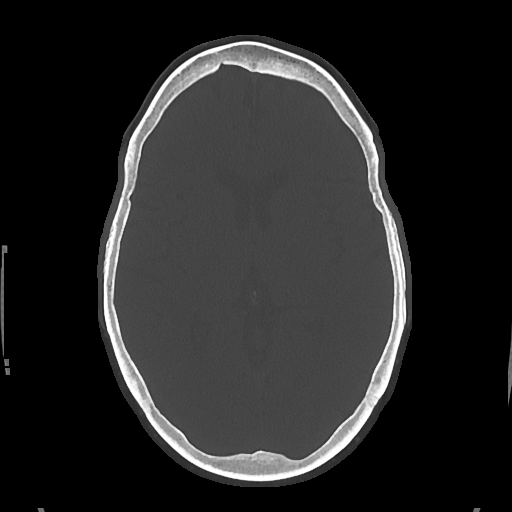

[Series 5: cor soft · coronal · 0.34mm/px · 3 of 75 slices shown]
[im 25/75  brain]
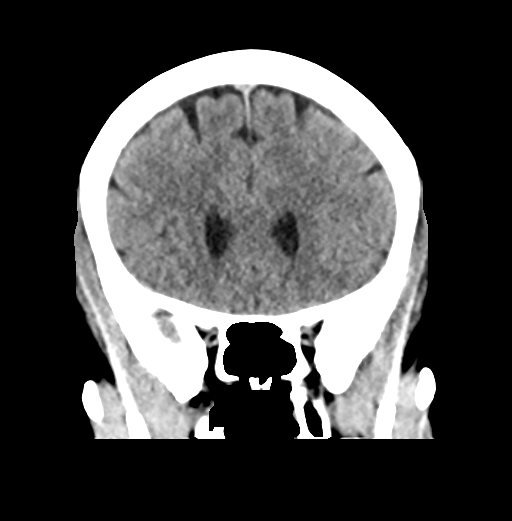
[im 33/75  brain]
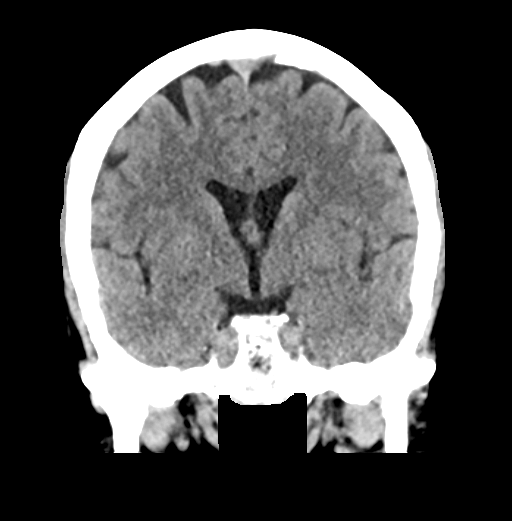
[im 42/75  brain]
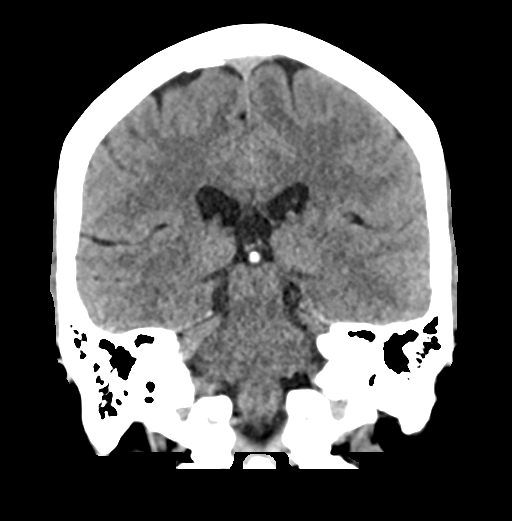

[Series 6: sag soft · sagittal · 0.36mm/px · 3 of 57 slices shown]
[im 19/57  brain]
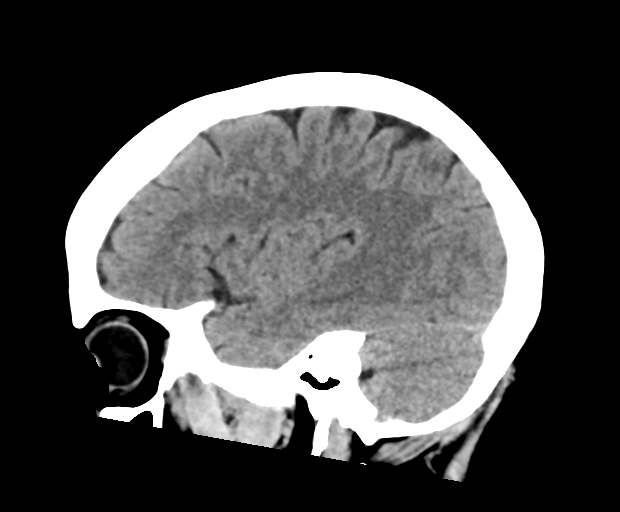
[im 29/57  brain]
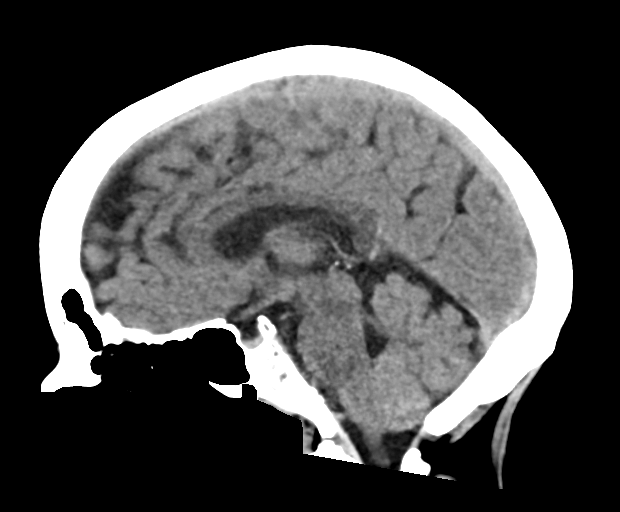
[im 38/57  brain]
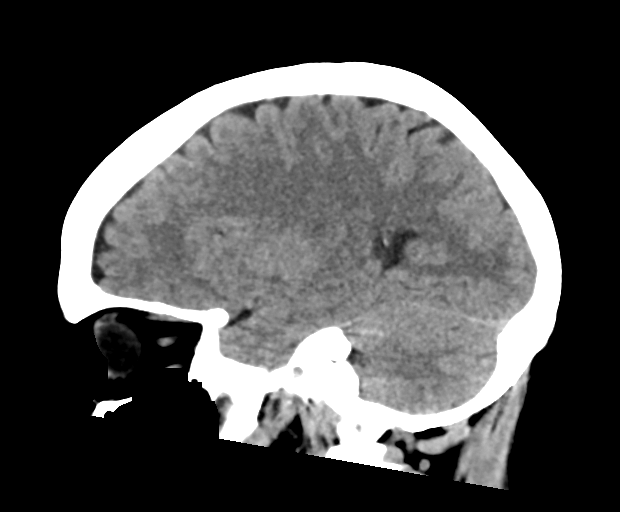

[17 of 47 positions shown; findings below may reference images not displayed]

FINDINGS: Brain: The ventricles are normal in size and configuration. There is
no intracranial mass, hemorrhage, extra-axial fluid collection, or
midline shift. The gray-white compartments appear within normal
limits. No evident acute infarct.

Vascular: No evident hyperdense vessel. There is slight
calcification in each cavernous carotid artery region.

Skull: Bony calvarium appears intact.

Sinuses/Orbits: There is slight mucosal thickening in several
ethmoid air cells. Other visualized paranasal sinuses are clear.
Orbits appear symmetric bilaterally.

Other: Mastoid air cells are clear.
IMPRESSION: No mass or hemorrhage. Gray-white compartments are normal. There is
slight arterial vascular calcification. There is slight mucosal
thickening in several ethmoid air cells.
